# Patient Record
Sex: Male | Born: 1955
Health system: Southern US, Community
[De-identification: ages and names within clinical notes are randomized; demographics above are authoritative.]

## PROBLEM LIST (undated history)

## (undated) DIAGNOSIS — E785 Hyperlipidemia, unspecified: Secondary | ICD-10-CM

## (undated) DIAGNOSIS — M1990 Unspecified osteoarthritis, unspecified site: Secondary | ICD-10-CM

## (undated) DIAGNOSIS — K219 Gastro-esophageal reflux disease without esophagitis: Secondary | ICD-10-CM

## (undated) DIAGNOSIS — E119 Type 2 diabetes mellitus without complications: Secondary | ICD-10-CM

## (undated) DIAGNOSIS — M109 Gout, unspecified: Secondary | ICD-10-CM

## (undated) HISTORY — DX: Type 2 diabetes mellitus without complications: E11.9

## (undated) HISTORY — DX: Hyperlipidemia, unspecified: E78.5

## (undated) HISTORY — DX: Gout, unspecified: M10.9

## (undated) HISTORY — DX: Gastro-esophageal reflux disease without esophagitis: K21.9

## (undated) HISTORY — DX: Unspecified osteoarthritis, unspecified site: M19.90

---

## 2011-01-20 ENCOUNTER — Emergency Department (HOSPITAL_COMMUNITY)
Admission: EM | Admit: 2011-01-20 | Discharge: 2011-01-20 | Disposition: A | Discharge status: Home / Self Care | Payer: 59 | Attending: Emergency Medicine | Admitting: Emergency Medicine

## 2011-01-20 DIAGNOSIS — R197 Diarrhea, unspecified: Secondary | ICD-10-CM | POA: Insufficient documentation

## 2011-01-20 DIAGNOSIS — R1033 Periumbilical pain: Secondary | ICD-10-CM | POA: Insufficient documentation

## 2011-01-20 LAB — COMPREHENSIVE METABOLIC PANEL
Albumin: 4 g/dL (ref 3.5–5.2)
BUN: 22 mg/dL (ref 6–23)
Creatinine, Ser: 1.18 mg/dL (ref 0.50–1.35)
Total Bilirubin: 0.5 mg/dL (ref 0.3–1.2)
Total Protein: 8 g/dL (ref 6.0–8.3)

## 2011-01-20 LAB — URINALYSIS, ROUTINE W REFLEX MICROSCOPIC
Ketones, ur: NEGATIVE mg/dL
Leukocytes, UA: NEGATIVE
Nitrite: NEGATIVE
Specific Gravity, Urine: 1.022 (ref 1.005–1.030)
pH: 5 (ref 5.0–8.0)

## 2011-01-20 LAB — DIFFERENTIAL
Eosinophils Relative: 0 % (ref 0–5)
Lymphocytes Relative: 11 % — ABNORMAL LOW (ref 12–46)
Lymphs Abs: 1.3 10*3/uL (ref 0.7–4.0)
Monocytes Absolute: 0.6 10*3/uL (ref 0.1–1.0)

## 2011-01-20 LAB — CBC
HCT: 49.9 % (ref 39.0–52.0)
MCH: 32.3 pg (ref 26.0–34.0)
MCHC: 35.9 g/dL (ref 30.0–36.0)
MCV: 89.9 fL (ref 78.0–100.0)
RDW: 13.3 % (ref 11.5–15.5)

## 2011-01-20 LAB — LIPASE, BLOOD: Lipase: 32 U/L (ref 11–59)

## 2014-05-14 ENCOUNTER — Ambulatory Visit: Payer: Self-pay

## 2014-05-14 ENCOUNTER — Other Ambulatory Visit: Payer: Self-pay | Admitting: Occupational Medicine

## 2014-05-14 DIAGNOSIS — M25561 Pain in right knee: Secondary | ICD-10-CM

## 2017-04-30 DIAGNOSIS — J019 Acute sinusitis, unspecified: Secondary | ICD-10-CM | POA: Diagnosis not present

## 2017-07-30 DIAGNOSIS — R972 Elevated prostate specific antigen [PSA]: Secondary | ICD-10-CM | POA: Diagnosis not present

## 2017-08-07 DIAGNOSIS — R972 Elevated prostate specific antigen [PSA]: Secondary | ICD-10-CM | POA: Diagnosis not present

## 2017-09-23 ENCOUNTER — Ambulatory Visit (HOSPITAL_COMMUNITY)
Admission: EM | Admit: 2017-09-23 | Discharge: 2017-09-23 | Disposition: A | Discharge status: Home / Self Care | Payer: Worker's Compensation | Attending: Family Medicine | Admitting: Family Medicine

## 2017-09-23 ENCOUNTER — Ambulatory Visit (INDEPENDENT_AMBULATORY_CARE_PROVIDER_SITE_OTHER): Payer: Worker's Compensation

## 2017-09-23 ENCOUNTER — Encounter (HOSPITAL_COMMUNITY): Payer: Self-pay

## 2017-09-23 DIAGNOSIS — S9031XA Contusion of right foot, initial encounter: Secondary | ICD-10-CM

## 2017-09-23 MED ORDER — IBUPROFEN 800 MG PO TABS: 800.0000 mg | ORAL_TABLET | Freq: Three times a day (TID) | ORAL | 0 refills | Status: DC

## 2017-09-23 NOTE — ED Provider Notes (Signed)
[facility]    CSN: [id number] Arrival date & time: 09/23/17  1345     History   Chief Complaint Chief Complaint  Patient presents with  . Foot Pain    HPI Matthew Bolton is a 62 y.o. male.   Matthew Bolton presents with complaints of right foot pain after an approximately 40lb steel plate fell and landed on the top of his foot today at 1130. Was wearing a steel toe boot but struck just above the steel toe area. Pain and tingling immediately following. No longer with any pain. No previous injury. No numbness or tingling. Coworker brought him in and made him sit in wheelchair, patient states he feels he could ambulate without difficulty. Without contributing medical history.     ROS per HPI.      History reviewed. No pertinent past medical history.  There are no active problems to display for this patient.   History reviewed. No pertinent surgical history.     Home Medications    Prior to Admission medications   Medication Sig Start Date End Date Taking? Authorizing Provider  ibuprofen (ADVIL,MOTRIN) 800 MG tablet Take 1 tablet (800 mg total) by mouth 3 (three) times daily. 09/23/17   Georgetta HaberBurky, Natalie B, NP    Family History No family history on file.  Social History Social History   Tobacco Use  . Smoking status: Not on file  . Smokeless tobacco: Never Used  Substance Use Topics  . Alcohol use: Never    Frequency: Never  . Drug use: Never     Allergies   Patient has no known allergies.   Review of Systems Review of Systems   Physical Exam Triage Vital Signs ED Triage Vitals [09/23/17 1427]  Enc Vitals Group     BP 129/80     Pulse Rate 75     Resp 18     Temp 97.9 F (36.6 C)     Temp src      SpO2 (!) 10 %     Weight      Height      Head Circumference      Peak Flow      Pain Score 0     Pain Loc      Pain Edu?      Excl. in GC?    No data found.  Updated Vital Signs BP 129/80   Pulse 75   Temp 97.9 F (36.6 C)   Resp  18   SpO2 (!) 10%   Visual Acuity Right Eye Distance:   Left Eye Distance:   Bilateral Distance:    Right Eye Near:   Left Eye Near:    Bilateral Near:     Physical Exam  Constitutional: He is oriented to person, place, and time. He appears well-developed and well-nourished.  Cardiovascular: Normal rate and regular rhythm.  Pulmonary/Chest: Effort normal and breath sounds normal.  Musculoskeletal:       Right ankle: Normal.       Right foot: There is tenderness, bony tenderness and swelling. There is normal range of motion, normal capillary refill, no crepitus, no deformity and no laceration.       Feet:  Bruising, swelling and tenderness to 2-4th metatarsals; cap refill<2 seconds; full toe and ankle ROm; strong pedal pulse   Neurological: He is alert and oriented to person, place, and time.  Skin: Skin is warm and dry.     UC Treatments / Results  Labs (all labs ordered  are listed, but only abnormal results are displayed) Labs Reviewed - No data to display  EKG None  Radiology Dg Foot Complete Right  Result Date: 09/23/2017 CLINICAL DATA:  Steel plate fell on the foot. EXAM: RIGHT FOOT COMPLETE - 3+ VIEW COMPARISON:  None. FINDINGS: There is no evidence of fracture or dislocation. There is moderate osteoarthritis of the first MTP joint. There is a small plantar calcaneal spur. Soft tissues are unremarkable. IMPRESSION: No acute osseous injury of the right foot. Electronically Signed   By: Elige Ko   On: 09/23/2017 14:54    Procedures Procedures (including critical care time)  Medications Ordered in UC Medications - No data to display  Initial Impression / Assessment and Plan / UC Course  I have reviewed the triage vital signs and the nursing notes.  Pertinent labs & imaging results that were available during my care of the patient were reviewed by me and considered in my medical decision making (see chart for details).     Bruising visible. Xray without acute  findings of fracture. Consistent with contusion. Ice, elevation, ibuprofen for pain control. Return precautions provided. Patient verbalized understanding and agreeable to plan.   Final Clinical Impressions(s) / UC Diagnoses   Final diagnoses:  Contusion of right foot, initial encounter     Discharge Instructions     Ice, elevation, ibuprofen for pain control. May take 1-2 weeks for bruising to completely resolve. If persistent symptoms or worsening may return or follow with PCP and/or occupational health office.     ED Prescriptions    Medication Sig Dispense Auth. Provider   ibuprofen (ADVIL,MOTRIN) 800 MG tablet Take 1 tablet (800 mg total) by mouth 3 (three) times daily. 21 tablet Georgetta Haber, NP     Controlled Substance Prescriptions [place] Controlled Substance Registry consulted? Not Applicable   Georgetta Haber, NP 09/23/17 1505

## 2017-09-23 NOTE — ED Triage Notes (Signed)
Pt presents with complaints of hurting right foot at work today. Denies any pain.

## 2017-09-23 NOTE — Discharge Instructions (Signed)
Ice, elevation, ibuprofen for pain control. May take 1-2 weeks for bruising to completely resolve. If persistent symptoms or worsening may return or follow with PCP and/or occupational health office.

## 2017-10-10 DIAGNOSIS — R12 Heartburn: Secondary | ICD-10-CM | POA: Diagnosis not present

## 2017-10-10 DIAGNOSIS — R05 Cough: Secondary | ICD-10-CM | POA: Diagnosis not present

## 2018-07-31 ENCOUNTER — Encounter (HOSPITAL_COMMUNITY): Payer: Self-pay | Admitting: Emergency Medicine

## 2018-07-31 ENCOUNTER — Other Ambulatory Visit: Payer: Self-pay

## 2018-07-31 ENCOUNTER — Ambulatory Visit (HOSPITAL_COMMUNITY)
Admission: EM | Admit: 2018-07-31 | Discharge: 2018-07-31 | Disposition: A | Discharge status: Home / Self Care | Payer: 59 | Attending: Family Medicine | Admitting: Family Medicine

## 2018-07-31 ENCOUNTER — Ambulatory Visit (INDEPENDENT_AMBULATORY_CARE_PROVIDER_SITE_OTHER): Payer: 59

## 2018-07-31 DIAGNOSIS — M545 Low back pain, unspecified: Secondary | ICD-10-CM

## 2018-07-31 DIAGNOSIS — M47817 Spondylosis without myelopathy or radiculopathy, lumbosacral region: Secondary | ICD-10-CM | POA: Diagnosis not present

## 2018-07-31 DIAGNOSIS — L409 Psoriasis, unspecified: Secondary | ICD-10-CM

## 2018-07-31 DIAGNOSIS — M47814 Spondylosis without myelopathy or radiculopathy, thoracic region: Secondary | ICD-10-CM

## 2018-07-31 MED ORDER — METHYLPREDNISOLONE 4 MG PO TBPK: ORAL_TABLET | ORAL | 0 refills | Status: DC

## 2018-07-31 MED ORDER — IBUPROFEN 800 MG PO TABS: 800.0000 mg | ORAL_TABLET | Freq: Three times a day (TID) | ORAL | 0 refills | Status: DC

## 2018-07-31 MED ORDER — TIZANIDINE HCL 4 MG PO TABS: 4.0000 mg | ORAL_TABLET | Freq: Four times a day (QID) | ORAL | 0 refills | Status: DC | PRN

## 2018-07-31 NOTE — ED Provider Notes (Addendum)
[facility]    CSN: [id number] Arrival date & time: 07/31/18  3524     History   Chief Complaint Chief Complaint  Patient presents with   Back Pain    HPI Matthew Bolton is a 63 y.o. male.   HPI  Pleasant 63 year old gentleman who is here for low back pain.  He states he is never needed treatment for low back pain previously.  He is only had very minor soreness in the past.  He states that he feels like he is healthy in general, is on no prescription medication.  He has psoriasis that is had for many years.  No known back condition.  He states that he was shoveling sand over the weekend and a couple days later developed some soreness in his back.  It hurts in the morning when he wakes up.  He feels "bent over".  It takes a while to straighten up.  Is in the central low back.  No radiation.  No numbness.  No weakness. Patient has no bowel or bladder complaint He has a history of prostate enlargement but no prostate cancer.  He has had prostate biopsies He states he does have a family history of back problems, father with spinal stenosis  History reviewed. No pertinent past medical history.  There are no active problems to display for this patient.   History reviewed. No pertinent surgical history.     Home Medications    Prior to Admission medications   Medication Sig Start Date End Date Taking? Authorizing Provider  Aspirin-Acetaminophen-Caffeine (EXCEDRIN EXTRA STRENGTH PO) Take by mouth.   Yes [provider]  ibuprofen (ADVIL) 800 MG tablet Take 1 tablet (800 mg total) by mouth 3 (three) times daily. 07/31/18   Eustace Moore, MD  methylPREDNISolone (MEDROL DOSEPAK) 4 MG TBPK tablet Tad 07/31/18   Eustace Moore, MD  tiZANidine (ZANAFLEX) 4 MG tablet Take 1-2 tablets (4-8 mg total) by mouth every 6 (six) hours as needed for muscle spasms. 07/31/18   Eustace Moore, MD    Family History Family History  Problem Relation Age of Onset    Hypertension Mother    Cancer Mother    Diabetes Father     Social History Social History   Tobacco Use   Smoking status: Never Smoker   Smokeless tobacco: Never Used  Substance Use Topics   Alcohol use: Never    Frequency: Never   Drug use: Never     Allergies   Patient has no known allergies.   Review of Systems Review of Systems  Constitutional: Negative for chills and fever.  HENT: Negative for ear pain and sore throat.   Eyes: Negative for pain and visual disturbance.  Respiratory: Negative for cough and shortness of breath.   Cardiovascular: Negative for chest pain and palpitations.  Gastrointestinal: Negative for abdominal pain and vomiting.  Genitourinary: Negative for dysuria and hematuria.  Musculoskeletal: Positive for back pain. Negative for arthralgias.  Skin: Positive for rash. Negative for color change.  Neurological: Negative for seizures and syncope.  All other systems reviewed and are negative.    Physical Exam Triage Vital Signs ED Triage Vitals  Enc Vitals Group     BP 07/31/18 0830 (!) 142/98     Pulse Rate 07/31/18 0830 85     Resp 07/31/18 0830 18     Temp 07/31/18 0830 98.3 F (36.8 C)     Temp Source 07/31/18 0830 Oral     SpO2  07/31/18 0830 96 %     Weight --      Height --      Head Circumference --      Peak Flow --      Pain Score 07/31/18 0827 8     Pain Loc --      Pain Edu? --      Excl. in GC? --    No data found.  Updated Vital Signs BP (!) 142/98 (BP Location: Right Arm) Comment: large cuff   Pulse 85    Temp 98.3 F (36.8 C) (Oral)    Resp 18    SpO2 96%       Physical Exam Constitutional:      General: He is not in acute distress.    Appearance: He is well-developed. He is obese.     Comments: He appears uncomfortable.  Slow guarded movements.  HENT:     Head: Normocephalic and atraumatic.  Eyes:     Conjunctiva/sclera: Conjunctivae normal.     Pupils: Pupils are equal, round, and reactive to light.    Neck:     Musculoskeletal: Normal range of motion and neck supple.  Cardiovascular:     Rate and Rhythm: Normal rate and regular rhythm.     Heart sounds: Normal heart sounds.  Pulmonary:     Effort: Pulmonary effort is normal. No respiratory distress.     Breath sounds: Normal breath sounds.  Abdominal:     General: There is no distension.     Palpations: Abdomen is soft.     Comments: Protuberant  Musculoskeletal: Normal range of motion.     Comments: Lumbar spine is straight and symmetric.  Limited range of motion especially to flexion and extension. No tenderness or muscle spasm. Strength, sensation, range of motion, and reflexes are normal in both lower extremities. Straight leg raise is negative bilateral.   Skin:    General: Skin is warm and dry.  Neurological:     Mental Status: He is alert.      UC Treatments / Results  Labs (all labs ordered are listed, but only abnormal results are displayed) Labs Reviewed - No data to display  EKG None  Radiology Dg Lumbar Spine Complete  Result Date: 07/31/2018 CLINICAL DATA:  Lower back pain for 2 days EXAM: LUMBAR SPINE - COMPLETE 4+ VIEW COMPARISON:  None. FINDINGS: No evidence of acute fracture, endplate erosion, or bone lesion. Generalized spondylosis with L4-5 and L5-S1 moderate disc narrowing. There is also lower lumbar facet spurring. IMPRESSION: 1. No acute finding. 2. Spondylosis with lower lumbar disc and facet degeneration. Electronically Signed   By: Marnee SpringJonathon  Watts M.D.   On: 07/31/2018 09:37    Procedures Procedures (including critical care time)  Medications Ordered in UC Medications - No data to display  Initial Impression / Assessment and Plan / UC Course  I have reviewed the triage vital signs and the nursing notes.  Pertinent labs & imaging results that were available during my care of the patient were reviewed by me and considered in my medical decision making (see chart for details).     he has  multilevel spondylosis with fusion of the lower thoracic spines.  With the diffuse spondylitic changes I am worried that he has a psoriatic arthritis condition or possibly ankylosing spondylitis.  I recommended acute treatment for his pain, but he must follow-up with his PCP to get a referral to rheumatology.  He is advised that he may need blood  work, additional imaging. Greater than 50% of this visit was spent in counseling and coordinating care.  Total face to face time:   45 min discussing spondylosis, etiology, prognosis, Tx Final Clinical Impressions(s) / UC Diagnoses   Final diagnoses:  Acute midline low back pain without sciatica  Lumbosacral spondylosis without myelopathy  Thoracic spondylosis without myelopathy  Psoriasis     Discharge Instructions     Activity as tolerated.  Avoid bending and lifting activities Take the prednisone pack as directed.  Take all of day 1 today.  This is a strong anti-inflammatory. Take the muscle relaxer as needed.  This is useful at bedtime When you are done with the prednisone pack start ibuprofen 800 mg 3 times a day with food If you need extra pain control while on prednisone, take your extra strength or arthritis strength Tylenol You need PCP follow-up.  I have referred you to a PCP referral service    ED Prescriptions    Medication Sig Dispense Auth. Provider   ibuprofen (ADVIL) 800 MG tablet Take 1 tablet (800 mg total) by mouth 3 (three) times daily. 21 tablet Eustace Moore, MD   methylPREDNISolone (MEDROL DOSEPAK) 4 MG TBPK tablet Tad 21 tablet Eustace Moore, MD   tiZANidine (ZANAFLEX) 4 MG tablet Take 1-2 tablets (4-8 mg total) by mouth every 6 (six) hours as needed for muscle spasms. 21 tablet Eustace Moore, MD     Controlled Substance Prescriptions [place] Controlled Substance Registry consulted? Not Applicable   Eustace Moore, MD 07/31/18 1012    Eustace Moore, MD 07/31/18 1021

## 2018-07-31 NOTE — ED Triage Notes (Signed)
Lower back pain for a week.  Pain has gradually worsened since onset.  No leg pain or tingling.  Reports "uncomfortable" sensation in lower abdomen.    Patient reports doing yard work the weekend prior to this pain starting

## 2018-07-31 NOTE — Discharge Instructions (Signed)
Activity as tolerated.  Avoid bending and lifting activities Take the prednisone pack as directed.  Take all of day 1 today.  This is a strong anti-inflammatory. Take the muscle relaxer as needed.  This is useful at bedtime When you are done with the prednisone pack start ibuprofen 800 mg 3 times a day with food If you need extra pain control while on prednisone, take your extra strength or arthritis strength Tylenol You need PCP follow-up.  I have referred you to a PCP referral service

## 2018-08-21 ENCOUNTER — Other Ambulatory Visit: Payer: Self-pay

## 2018-08-21 ENCOUNTER — Telehealth: Payer: Self-pay | Admitting: Family Medicine

## 2018-08-22 ENCOUNTER — Encounter: Payer: Self-pay | Admitting: Family Medicine

## 2018-08-22 ENCOUNTER — Ambulatory Visit (INDEPENDENT_AMBULATORY_CARE_PROVIDER_SITE_OTHER): Payer: 59 | Admitting: Family Medicine

## 2018-08-22 VITALS — BP 120/83 | HR 77 | Temp 97.0°F | Ht 71.0 in | Wt 232.0 lb

## 2018-08-22 DIAGNOSIS — L409 Psoriasis, unspecified: Secondary | ICD-10-CM | POA: Diagnosis not present

## 2018-08-22 DIAGNOSIS — Z0000 Encounter for general adult medical examination without abnormal findings: Secondary | ICD-10-CM

## 2018-08-22 DIAGNOSIS — Z1159 Encounter for screening for other viral diseases: Secondary | ICD-10-CM | POA: Diagnosis not present

## 2018-08-22 DIAGNOSIS — Z0001 Encounter for general adult medical examination with abnormal findings: Secondary | ICD-10-CM

## 2018-08-22 DIAGNOSIS — Z1211 Encounter for screening for malignant neoplasm of colon: Secondary | ICD-10-CM | POA: Diagnosis not present

## 2018-08-22 NOTE — Progress Notes (Signed)
BP 120/83   Pulse 77   Temp (!) 97 F (36.1 C) (Oral)   Ht '5\' 11"'  (1.803 m)   Wt 232 lb (105.2 kg)   BMI 32.36 kg/m    Subjective:    Patient ID: Matthew Bolton, male    DOB: 03/06/1956, 63 y.o.   MRN: [id number]  HPI: Matthew Bolton is a 63 y.o. male presenting on 08/22/2018 for New Patient (Initial Visit) (Dr. Scotty Court); Establish Care; and Annual Exam   HPI Well adult exam and physical Patient is coming in for new patient exam and physical today.  He denies any major health issues except for psoriasis and he is wondering if he can get a refill on a cream that he has had this worked for his psoriasis that contains salicylic acid.  Other than this he denies any major health issues or concerns.  Patient is coming from Dr. Scotty Court because Dr. Scotty Court retired. Patient denies any chest pain, shortness of breath, headaches or vision issues, abdominal complaints, diarrhea, nausea, vomiting, or joint issues.   Relevant past medical, surgical, family and social history reviewed and updated as indicated. Interim medical history since our last visit reviewed. Allergies and medications reviewed and updated.  Review of Systems  Constitutional: Negative for chills and fever.  HENT: Negative for ear pain and tinnitus.   Eyes: Negative for pain.  Respiratory: Negative for cough, shortness of breath and wheezing.   Cardiovascular: Negative for chest pain, palpitations and leg swelling.  Gastrointestinal: Negative for abdominal pain, blood in stool, constipation and diarrhea.  Genitourinary: Negative for dysuria and hematuria.  Musculoskeletal: Negative for back pain and myalgias.  Skin: Positive for rash (Psoriasis on elbows and a small amount on knees).  Neurological: Negative for dizziness, weakness and headaches.  Psychiatric/Behavioral: Negative for suicidal ideas.    Per HPI unless specifically indicated above  Social History   Socioeconomic History  . Marital status: Married   Spouse name: Not on file  . Number of children: 2  . Years of education: Not on file  . Highest education level: Not on file  Occupational History  . Not on file  Social Needs  . Financial resource strain: Not on file  . Food insecurity:    Worry: Not on file    Inability: Not on file  . Transportation needs:    Medical: Not on file    Non-medical: Not on file  Tobacco Use  . Smoking status: Former Smoker    Packs/day: 0.25    Years: 15.00    Pack years: 3.75    Last attempt to quit: 1980    Years since quitting: 40.4  . Smokeless tobacco: Never Used  Substance and Sexual Activity  . Alcohol use: Yes    Frequency: Never    Comment: occasionally  . Drug use: Never  . Sexual activity: Yes    Birth control/protection: Post-menopausal  Lifestyle  . Physical activity:    Days per week: Not on file    Minutes per session: Not on file  . Stress: Not on file  Relationships  . Social connections:    Talks on phone: Not on file    Gets together: Not on file    Attends religious service: Not on file    Active member of club or organization: Not on file    Attends meetings of clubs or organizations: Not on file    Relationship status: Not on file  . Intimate partner violence:    Fear  of current or ex partner: Not on file    Emotionally abused: Not on file    Physically abused: Not on file    Forced sexual activity: Not on file  Other Topics Concern  . Not on file  Social History Narrative  . Not on file    History reviewed. No pertinent surgical history.  Family History  Problem Relation Age of Onset  . Hypertension Mother   . Cancer Mother   . Diabetes Father     Allergies as of 08/22/2018   No Known Allergies     Medication List       Accurate as of Aug 22, 2018 10:23 AM. If you have any questions, ask your nurse or doctor.        STOP taking these medications   methylPREDNISolone 4 MG Tbpk tablet Commonly known as:  MEDROL DOSEPAK Stopped by:  Fransisca Kaufmann Miley Blanchett, MD     TAKE these medications   EXCEDRIN EXTRA STRENGTH PO Take by mouth.   ibuprofen 800 MG tablet Commonly known as:  ADVIL Take 1 tablet (800 mg total) by mouth 3 (three) times daily.   tiZANidine 4 MG tablet Commonly known as:  Zanaflex Take 1-2 tablets (4-8 mg total) by mouth every 6 (six) hours as needed for muscle spasms.          Objective:    BP 120/83   Pulse 77   Temp (!) 97 F (36.1 C) (Oral)   Ht '5\' 11"'  (1.803 m)   Wt 232 lb (105.2 kg)   BMI 32.36 kg/m   Wt Readings from Last 3 Encounters:  08/22/18 232 lb (105.2 kg)    Physical Exam Vitals signs and nursing note reviewed.  Constitutional:      General: He is not in acute distress.    Appearance: He is well-developed. He is not diaphoretic.  HENT:     Right Ear: External ear normal.     Left Ear: External ear normal.     Nose: Nose normal.     Mouth/Throat:     Pharynx: No oropharyngeal exudate.  Eyes:     General: No scleral icterus.       Right eye: No discharge.     Conjunctiva/sclera: Conjunctivae normal.     Pupils: Pupils are equal, round, and reactive to light.  Neck:     Musculoskeletal: Neck supple.     Thyroid: No thyromegaly.  Cardiovascular:     Rate and Rhythm: Normal rate and regular rhythm.     Heart sounds: Normal heart sounds. No murmur.  Pulmonary:     Effort: Pulmonary effort is normal. No respiratory distress.     Breath sounds: Normal breath sounds. No wheezing.  Abdominal:     General: Bowel sounds are normal. There is no distension.     Palpations: Abdomen is soft.     Tenderness: There is no abdominal tenderness. There is no guarding or rebound.  Musculoskeletal: Normal range of motion.  Lymphadenopathy:     Cervical: No cervical adenopathy.  Skin:    General: Skin is warm and dry.     Findings: Rash (Psoriatic plaque on both elbow extensor surfaces, a very small plaque on left knee) present.  Neurological:     Mental Status: He is alert and oriented  to person, place, and time.     Coordination: Coordination normal.  Psychiatric:        Behavior: Behavior normal.  Assessment & Plan:   Problem List Items Addressed This Visit      Musculoskeletal and Integument   Psoriasis    Other Visit Diagnoses    Well adult exam    -  Primary   Relevant Orders   Hepatitis C antibody   CBC with Differential/Platelet   CMP14+EGFR   Lipid panel   Colon cancer screening       Relevant Orders   Cologuard   Need for hepatitis C screening test       Relevant Orders   Hepatitis C antibody      Refill of medication for psoriasis which was salicylic acid 38% in petrolatum that mixed at [facility] drug, did a handwritten prescription for this  Gave Cologuard and will check blood work Follow up plan: Return in about 1 year (around 08/22/2019), or if symptoms worsen or fail to improve, for Physical.  Caryl Pina, MD [place] 08/22/2018, 10:23 AM

## 2018-08-23 LAB — CMP14+EGFR
ALT: 23 IU/L (ref 0–44)
AST: 17 IU/L (ref 0–40)
Albumin/Globulin Ratio: 1.5 (ref 1.2–2.2)
Albumin: 4.1 g/dL (ref 3.8–4.8)
Alkaline Phosphatase: 89 IU/L (ref 39–117)
BUN/Creatinine Ratio: 15 (ref 10–24)
BUN: 19 mg/dL (ref 8–27)
Bilirubin Total: 0.7 mg/dL (ref 0.0–1.2)
CO2: 21 mmol/L (ref 20–29)
Calcium: 9.3 mg/dL (ref 8.6–10.2)
Chloride: 101 mmol/L (ref 96–106)
Creatinine, Ser: 1.29 mg/dL — ABNORMAL HIGH (ref 0.76–1.27)
GFR calc Af Amer: 69 mL/min/{1.73_m2} (ref >59)
GFR calc non Af Amer: 59 mL/min/{1.73_m2} — ABNORMAL LOW (ref >59)
Globulin, Total: 2.7 g/dL (ref 1.5–4.5)
Glucose: 164 mg/dL — ABNORMAL HIGH (ref 65–99)
Potassium: 4.4 mmol/L (ref 3.5–5.2)
Sodium: 138 mmol/L (ref 134–144)
Total Protein: 6.8 g/dL (ref 6.0–8.5)

## 2018-08-23 LAB — CBC WITH DIFFERENTIAL/PLATELET
Basophils Absolute: 0.1 10*3/uL (ref 0.0–0.2)
Basos: 1 %
EOS (ABSOLUTE): 0.3 10*3/uL (ref 0.0–0.4)
Eos: 4 %
Hematocrit: 50.8 % (ref 37.5–51.0)
Hemoglobin: 16.6 g/dL (ref 13.0–17.7)
Immature Grans (Abs): 0 10*3/uL (ref 0.0–0.1)
Immature Granulocytes: 1 %
Lymphocytes Absolute: 1.6 10*3/uL (ref 0.7–3.1)
Lymphs: 23 %
MCH: 29.3 pg (ref 26.6–33.0)
MCHC: 32.7 g/dL (ref 31.5–35.7)
MCV: 90 fL (ref 79–97)
Monocytes Absolute: 0.5 10*3/uL (ref 0.1–0.9)
Monocytes: 8 %
Neutrophils Absolute: 4.4 10*3/uL (ref 1.4–7.0)
Neutrophils: 63 %
Platelets: 152 10*3/uL (ref 150–450)
RBC: 5.66 x10E6/uL (ref 4.14–5.80)
RDW: 13.1 % (ref 11.6–15.4)
WBC: 7 10*3/uL (ref 3.4–10.8)

## 2018-08-23 LAB — HEPATITIS C ANTIBODY: Hep C Virus Ab: 0.2 s/co ratio (ref 0.0–0.9)

## 2018-08-23 LAB — LIPID PANEL
Chol/HDL Ratio: 5.7 ratio — ABNORMAL HIGH (ref 0.0–5.0)
Cholesterol, Total: 228 mg/dL — ABNORMAL HIGH (ref 100–199)
HDL: 40 mg/dL (ref >39)
LDL Calculated: 157 mg/dL — ABNORMAL HIGH (ref 0–99)
Triglycerides: 153 mg/dL — ABNORMAL HIGH (ref 0–149)
VLDL Cholesterol Cal: 31 mg/dL (ref 5–40)

## 2018-09-11 ENCOUNTER — Encounter: Payer: Self-pay | Admitting: *Deleted

## 2018-09-23 ENCOUNTER — Telehealth: Payer: Self-pay | Admitting: Family Medicine

## 2018-09-23 MED ORDER — ATORVASTATIN CALCIUM 20 MG PO TABS: 20.0000 mg | ORAL_TABLET | Freq: Every day | ORAL | 0 refills | Status: DC

## 2018-09-23 NOTE — Telephone Encounter (Signed)
Left message.  Medication sent to pharmacy.  Will need to come back in for A1C

## 2018-10-13 ENCOUNTER — Other Ambulatory Visit: Payer: Self-pay

## 2018-10-13 ENCOUNTER — Other Ambulatory Visit: Payer: 59

## 2018-10-13 DIAGNOSIS — R7309 Other abnormal glucose: Secondary | ICD-10-CM

## 2018-10-13 LAB — BAYER DCA HB A1C WAIVED: HB A1C (BAYER DCA - WAIVED): 7.5 % — ABNORMAL HIGH (ref <7.0)

## 2018-10-16 LAB — COLOGUARD: Cologuard: NEGATIVE

## 2018-10-17 ENCOUNTER — Other Ambulatory Visit: Payer: Self-pay | Admitting: *Deleted

## 2018-10-17 DIAGNOSIS — E119 Type 2 diabetes mellitus without complications: Secondary | ICD-10-CM

## 2018-10-17 MED ORDER — METFORMIN HCL 500 MG PO TABS: 500.0000 mg | ORAL_TABLET | Freq: Two times a day (BID) | ORAL | 0 refills | Status: DC

## 2018-11-10 ENCOUNTER — Ambulatory Visit (INDEPENDENT_AMBULATORY_CARE_PROVIDER_SITE_OTHER): Payer: 59 | Admitting: Family Medicine

## 2018-11-10 ENCOUNTER — Encounter: Payer: Self-pay | Admitting: Family Medicine

## 2018-11-10 DIAGNOSIS — J0100 Acute maxillary sinusitis, unspecified: Secondary | ICD-10-CM | POA: Diagnosis not present

## 2018-11-10 MED ORDER — FLUTICASONE PROPIONATE 50 MCG/ACT NA SUSP: 2.0000 | Freq: Every day | NASAL | 6 refills | Status: DC

## 2018-11-10 MED ORDER — AMOXICILLIN-POT CLAVULANATE 875-125 MG PO TABS: 1.0000 | ORAL_TABLET | Freq: Two times a day (BID) | ORAL | 0 refills | Status: AC

## 2018-11-10 MED ORDER — PREDNISONE 20 MG PO TABS: ORAL_TABLET | ORAL | 0 refills | Status: DC

## 2018-11-10 NOTE — Progress Notes (Addendum)
Virtual Visit via telephone Note Due to COVID-19 pandemic this visit was conducted virtually. This visit type was conducted due to national recommendations for restrictions regarding the COVID-19 Pandemic (e.g. social distancing, sheltering in place) in an effort to limit this patient's exposure and mitigate transmission in our community. All issues noted in this document were discussed and addressed.  A physical exam was not performed with this format.   I connected with Matthew Bolton on 11/10/18 at 1415 by telephone and verified that I am speaking with the correct person using two identifiers. Matthew Bolton is currently located at home and family is currently with them during visit. The provider, Kari BaarsMichelle Marlane Hirschmann, FNP is located in their office at time of visit.  I discussed the limitations, risks, security and privacy concerns of performing an evaluation and management service by telephone and the availability of in person appointments. I also discussed with the patient that there may be a patient responsible charge related to this service. The patient expressed understanding and agreed to proceed.  Subjective:  Patient ID: Matthew Bolton, male    DOB: 02/14/1956, 63 y.o.   MRN: [id number]  Chief Complaint:  Sinus Problem   HPI: Matthew Bolton is a 63 y.o. male presenting on 11/10/2018 for Sinus Problem   Pt reports several days of maxillary sinus pressure, pressure behind his eyes, postnasal drainage, rhinorrhea, and cough. Pt states he has nasal congestion and puffiness under his eyes. He states he has been treating symptomatically without resolution of symptoms. No chest pain, shortness of breath, wheezing, or fatigue. Does have chills, has not checked temperature.     Relevant past medical, surgical, family, and social history reviewed and updated as indicated.  Allergies and medications reviewed and updated.   History reviewed. No pertinent past medical history.   History reviewed. No pertinent surgical history.  Social History   Socioeconomic History  . Marital status: Married    Spouse name: Not on file  . Number of children: 2  . Years of education: Not on file  . Highest education level: Not on file  Occupational History  . Not on file  Social Needs  . Financial resource strain: Not on file  . Food insecurity    Worry: Not on file    Inability: Not on file  . Transportation needs    Medical: Not on file    Non-medical: Not on file  Tobacco Use  . Smoking status: Former Smoker    Packs/day: 0.25    Years: 15.00    Pack years: 3.75    Quit date: 1980    Years since quitting: 40.6  . Smokeless tobacco: Never Used  Substance and Sexual Activity  . Alcohol use: Yes    Frequency: Never    Comment: occasionally  . Drug use: Never  . Sexual activity: Yes    Birth control/protection: Post-menopausal  Lifestyle  . Physical activity    Days per week: Not on file    Minutes per session: Not on file  . Stress: Not on file  Relationships  . Social Musicianconnections    Talks on phone: Not on file    Gets together: Not on file    Attends religious service: Not on file    Active member of club or organization: Not on file    Attends meetings of clubs or organizations: Not on file    Relationship status: Not on file  . Intimate partner violence    Fear of current or ex  partner: Not on file    Emotionally abused: Not on file    Physically abused: Not on file    Forced sexual activity: Not on file  Other Topics Concern  . Not on file  Social History Narrative  . Not on file    Outpatient Encounter Medications as of 11/10/2018  Medication Sig  . amoxicillin-clavulanate (AUGMENTIN) 875-125 MG tablet Take 1 tablet by mouth 2 (two) times daily for 10 days.  . Aspirin-Acetaminophen-Caffeine (EXCEDRIN EXTRA STRENGTH PO) Take by mouth.  Marland Kitchen. atorvastatin (LIPITOR) 20 MG tablet Take 1 tablet (20 mg total) by mouth daily.  . fluticasone (FLONASE)  50 MCG/ACT nasal spray Place 2 sprays into both nostrils daily.  Marland Kitchen. ibuprofen (ADVIL) 800 MG tablet Take 1 tablet (800 mg total) by mouth 3 (three) times daily.  . metFORMIN (GLUCOPHAGE) 500 MG tablet Take 1 tablet (500 mg total) by mouth 2 (two) times daily with a meal.  . predniSONE (DELTASONE) 20 MG tablet 2 po at sametime daily for 5 days  . tiZANidine (ZANAFLEX) 4 MG tablet Take 1-2 tablets (4-8 mg total) by mouth every 6 (six) hours as needed for muscle spasms.   No facility-administered encounter medications on file as of 11/10/2018.     No Known Allergies  Review of Systems  Constitutional: Positive for chills. Negative for activity change, appetite change, diaphoresis, fatigue, fever and unexpected weight change.  HENT: Positive for congestion, facial swelling, postnasal drip, rhinorrhea, sinus pressure, sinus pain and sore throat. Negative for hearing loss, mouth sores, nosebleeds, sneezing, tinnitus, trouble swallowing and voice change.   Eyes: Negative.  Negative for photophobia and visual disturbance.  Respiratory: Positive for cough. Negative for chest tightness, shortness of breath and wheezing.   Cardiovascular: Negative for chest pain, palpitations and leg swelling.  Gastrointestinal: Negative for abdominal pain, blood in stool, constipation, diarrhea, nausea and vomiting.  Endocrine: Negative.   Genitourinary: Negative for decreased urine volume, difficulty urinating, dysuria, frequency and urgency.  Musculoskeletal: Negative for arthralgias and myalgias.  Skin: Negative.   Allergic/Immunologic: Negative.   Neurological: Positive for headaches. Negative for dizziness, weakness and light-headedness.  Hematological: Negative.   Psychiatric/Behavioral: Negative for confusion, hallucinations, sleep disturbance and suicidal ideas.  All other systems reviewed and are negative.        Observations/Objective: No vital signs or physical exam, this was a telephone or virtual  health encounter.  Pt alert and oriented, answers all questions appropriately, and able to speak in full sentences.    Assessment and Plan: Matthew Bolton was seen today for sinus problem.  Diagnoses and all orders for this visit:  Acute non-recurrent maxillary sinusitis Ongoing maxillary sinus pressure and congestion that has not resolved with symptomatic care. Due to ongoing and worsening symptoms, will initiate Augmentin. Continue symptomatic care. Increase water intake. Use frequent saline nasal sprays. Medications as prescribed. Report any new or worsening symptoms. Follow up as needed.  -     amoxicillin-clavulanate (AUGMENTIN) 875-125 MG tablet; Take 1 tablet by mouth 2 (two) times daily for 10 days. -     fluticasone (FLONASE) 50 MCG/ACT nasal spray; Place 2 sprays into both nostrils daily. -     predniSONE (DELTASONE) 20 MG tablet; 2 po at sametime daily for 5 days     Follow Up Instructions: Return if symptoms worsen or fail to improve.    I discussed the assessment and treatment plan with the patient. The patient was provided an opportunity to ask questions and all were answered. The patient  agreed with the plan and demonstrated an understanding of the instructions.   The patient was advised to call back or seek an in-person evaluation if the symptoms worsen or if the condition fails to improve as anticipated.  The above assessment and management plan was discussed with the patient. The patient verbalized understanding of and has agreed to the management plan. Patient is aware to call the clinic if symptoms persist or worsen. Patient is aware when to return to the clinic for a follow-up visit. Patient educated on when it is appropriate to go to the emergency department.    I provided 15 minutes of non-face-to-face time during this encounter. The call started at 1415. The call ended at 1430. The other time was used for coordination of care.    Monia Pouch, FNP-C [facility] [address] [place], [place] [address] [phone] 11/10/18

## 2018-11-27 ENCOUNTER — Ambulatory Visit: Payer: 59 | Admitting: Family Medicine

## 2018-12-10 ENCOUNTER — Telehealth: Payer: Self-pay | Admitting: Nutrition

## 2018-12-10 NOTE — Telephone Encounter (Signed)
vm left to let him know we can do mychart viist or face to face office visit since I'll be back in office.Asked him to return call to let me know.

## 2018-12-16 ENCOUNTER — Encounter: Payer: 59 | Attending: Family Medicine | Admitting: Nutrition

## 2019-01-05 ENCOUNTER — Ambulatory Visit: Payer: 59 | Admitting: Nutrition

## 2019-02-09 ENCOUNTER — Telehealth: Payer: Self-pay | Admitting: Family Medicine

## 2019-02-10 ENCOUNTER — Ambulatory Visit: Payer: 59 | Admitting: Family Medicine

## 2019-02-10 ENCOUNTER — Other Ambulatory Visit: Payer: Self-pay

## 2019-02-10 ENCOUNTER — Encounter: Payer: Self-pay | Admitting: Family Medicine

## 2019-02-10 VITALS — BP 124/88 | HR 104 | Temp 99.1°F | Ht 71.0 in | Wt 233.0 lb

## 2019-02-10 DIAGNOSIS — M10372 Gout due to renal impairment, left ankle and foot: Secondary | ICD-10-CM | POA: Diagnosis not present

## 2019-02-10 MED ORDER — COLCHICINE 0.6 MG PO TABS: ORAL_TABLET | ORAL | 1 refills | Status: AC

## 2019-02-10 MED ORDER — METHYLPREDNISOLONE ACETATE 40 MG/ML IJ SUSP
40.0000 mg | Freq: Once | INTRAMUSCULAR | Status: AC
  Administered 2019-02-10: 40 mg via INTRAMUSCULAR

## 2019-02-10 NOTE — Progress Notes (Signed)
Subjective: CC: Gout PCP: Dettinger, Elige Radon, MD VPX:TGGYIRS Dains is a 63 y.o. male presenting to clinic today for:  1.  Gout Patient reports onset of left great toe swelling and pain on Saturday.  Pain and swelling got quite a bit worse.  He has been keeping the foot elevated but not really taking any medication for this issue.  He previously was prescribed colchicine by his previous PCP.  He does not have any more of these pills.  Denies any recent change in diet including consumption of red meats or beer.  ROS: Per HPI  No Known Allergies No past medical history on file.  Current Outpatient Medications:  .  Aspirin-Acetaminophen-Caffeine (EXCEDRIN EXTRA STRENGTH PO), Take by mouth., Disp: , Rfl:  .  atorvastatin (LIPITOR) 20 MG tablet, Take 1 tablet (20 mg total) by mouth daily., Disp: 90 tablet, Rfl: 0 .  ibuprofen (ADVIL) 800 MG tablet, Take 1 tablet (800 mg total) by mouth 3 (three) times daily., Disp: 21 tablet, Rfl: 0 .  metFORMIN (GLUCOPHAGE) 500 MG tablet, Take 1 tablet (500 mg total) by mouth 2 (two) times daily with a meal., Disp: 180 tablet, Rfl: 0 .  colchicine 0.6 MG tablet, Day 1: Take 1.2 mg at the first sign of flare, followed in 1 hour with a single dose of 0.6 mg. Then 1 tablet twice daily until gout resolved., Disp: 60 tablet, Rfl: 1  Current Facility-Administered Medications:  .  methylPREDNISolone acetate (DEPO-MEDROL) injection 40 mg, 40 mg, Intramuscular, Once, Raliegh Ip, DO Social History   Socioeconomic History  . Marital status: Married    Spouse name: Not on file  . Number of children: 2  . Years of education: Not on file  . Highest education level: Not on file  Occupational History  . Not on file  Social Needs  . Financial resource strain: Not on file  . Food insecurity    Worry: Not on file    Inability: Not on file  . Transportation needs    Medical: Not on file    Non-medical: Not on file  Tobacco Use  . Smoking status:  Former Smoker    Packs/day: 0.25    Years: 15.00    Pack years: 3.75    Quit date: 1980    Years since quitting: 40.9  . Smokeless tobacco: Never Used  Substance and Sexual Activity  . Alcohol use: Yes    Frequency: Never    Comment: occasionally  . Drug use: Never  . Sexual activity: Yes    Birth control/protection: Post-menopausal  Lifestyle  . Physical activity    Days per week: Not on file    Minutes per session: Not on file  . Stress: Not on file  Relationships  . Social Musician on phone: Not on file    Gets together: Not on file    Attends religious service: Not on file    Active member of club or organization: Not on file    Attends meetings of clubs or organizations: Not on file    Relationship status: Not on file  . Intimate partner violence    Fear of current or ex partner: Not on file    Emotionally abused: Not on file    Physically abused: Not on file    Forced sexual activity: Not on file  Other Topics Concern  . Not on file  Social History Narrative  . Not on file   Family History  Problem Relation Age of Onset  . Hypertension Mother   . Cancer Mother   . Diabetes Father     Objective: Office vital signs reviewed. BP 124/88   Pulse (!) 104   Temp 99.1 F (37.3 C) (Temporal)   Ht 5\' 11"  (1.803 m)   Wt 233 lb (105.7 kg)   BMI 32.50 kg/m   Physical Examination:  General: Awake, alert, obese, No acute distress MSK: antalgic gait and station.  Left great toe: Erythema, warmth and mild soft tissue swelling noted around the first MCP.  He has mild tenderness to palpation.  Range of motion is limited secondary to pain and swelling. Extremities: Warm, well-perfused, no edema Neuro: Light touch sensation grossly intact  Assessment/ Plan: 63 y.o. male   1. Acute gout due to renal impairment involving toe of left foot Clinically consistent with gout flare.  I suspect secondary to renal impairment based on May labs.  He appears to have a  CKD 3.  I have dosed colchicine for him and we discussed the directions verbally.  I have also given him a dose of Depo-Medrol given pain.  We will obtain uric acid level, BMP and CBC.  Red flags discussed.  He will follow-up as needed - colchicine 0.6 MG tablet; Day 1: Take 1.2 mg at the first sign of flare, followed in 1 hour with a single dose of 0.6 mg. Then 1 tablet twice daily until gout resolved.  Dispense: 60 tablet; Refill: 1 - methylPREDNISolone acetate (DEPO-MEDROL) injection 40 mg - Uric Acid - CBC - Basic Metabolic Panel   Orders Placed This Encounter  Procedures  . Uric Acid  . CBC  . Basic Metabolic Panel   Meds ordered this encounter  Medications  . colchicine 0.6 MG tablet    Sig: Day 1: Take 1.2 mg at the first sign of flare, followed in 1 hour with a single dose of 0.6 mg. Then 1 tablet twice daily until gout resolved.    Dispense:  60 tablet    Refill:  1  . methylPREDNISolone acetate (DEPO-MEDROL) injection 40 mg     Janora Norlander, DO [facility] [phone]

## 2019-02-10 NOTE — Patient Instructions (Addendum)
Hold your cholesterol medication for the next few days while on the Colchicine.  You had labs performed today.  You will be contacted with the results of the labs once they are available, usually in the next 3 business days for routine lab work.  If you have an active my chart account, they will be released to your MyChart.  If you prefer to have these labs released to you via telephone, please let us know.  If you had a pap smear or biopsy performed, expect to be contacted in about 7-10 days.   Gout  Gout is painful swelling of your joints. Gout is a type of arthritis. It is caused by having too much uric acid in your body. Uric acid is a chemical that is made when your body breaks down substances called purines. If your body has too much uric acid, sharp crystals can form and build up in your joints. This causes pain and swelling. Gout attacks can happen quickly and be very painful (acute gout). Over time, the attacks can affect more joints and happen more often (chronic gout). What are the causes?  Too much uric acid in your blood. This can happen because: ? Your kidneys do not remove enough uric acid from your blood. ? Your body makes too much uric acid. ? You eat too many foods that are high in purines. These foods include organ meats, some seafood, and beer.  Trauma or stress. What increases the risk?  Having a family history of gout.  Being male and middle-aged.  Being male and having gone through menopause.  Being very overweight (obese).  Drinking alcohol, especially beer.  Not having enough water in the body (being dehydrated).  Losing weight too quickly.  Having an organ transplant.  Having lead poisoning.  Taking certain medicines.  Having kidney disease.  Having a skin condition called psoriasis. What are the signs or symptoms? An attack of acute gout usually happens in just one joint. The most common place is the big toe. Attacks often start at night.  Other joints that may be affected include joints of the feet, ankle, knee, fingers, wrist, or elbow. Symptoms of an attack may include:  Very bad pain.  Warmth.  Swelling.  Stiffness.  Shiny, red, or purple skin.  Tenderness. The affected joint may be very painful to touch.  Chills and fever. Chronic gout may cause symptoms more often. More joints may be involved. You may also have white or yellow lumps (tophi) on your hands or feet or in other areas near your joints. How is this treated?  Treatment for this condition has two phases: treating an acute attack and preventing future attacks.  Acute gout treatment may include: ? NSAIDs. ? Steroids. These are taken by mouth or injected into a joint. ? Colchicine. This medicine relieves pain and swelling. It can be given by mouth or through an IV tube.  Preventive treatment may include: ? Taking small doses of NSAIDs or colchicine daily. ? Using a medicine that reduces uric acid levels in your blood. ? Making changes to your diet. You may need to see a food expert (dietitian) about what to eat and drink to prevent gout. Follow these instructions at home: During a gout attack   If told, put ice on the painful area: ? Put ice in a plastic bag. ? Place a towel between your skin and the bag. ? Leave the ice on for 20 minutes, 2-3 times a day.  Raise (elevate)  the painful joint above the level of your heart as often as you can.  Rest the joint as much as possible. If the joint is in your leg, you may be given crutches.  Follow instructions from your doctor about what you cannot eat or drink. Avoiding future gout attacks  Eat a low-purine diet. Avoid foods and drinks such as: ? Liver. ? Kidney. ? Anchovies. ? Asparagus. ? Herring. ? Mushrooms. ? Mussels. ? Beer.  Stay at a healthy weight. If you want to lose weight, talk with your doctor. Do not lose weight too fast.  Start or continue an exercise plan as told by your  doctor. Eating and drinking  Drink enough fluids to keep your pee (urine) pale yellow.  If you drink alcohol: ? Limit how much you use to:  0-1 drink a day for women.  0-2 drinks a day for men. ? Be aware of how much alcohol is in your drink. In the [place] one drink equals one 12 oz bottle of beer (355 mL), one 5 oz glass of wine (148 mL), or one 1 oz glass of hard liquor (44 mL). General instructions  Take over-the-counter and prescription medicines only as told by your doctor.  Do not drive or use heavy machinery while taking prescription pain medicine.  Return to your normal activities as told by your doctor. Ask your doctor what activities are safe for you.  Keep all follow-up visits as told by your doctor. This is important. Contact a doctor if:  You have another gout attack.  You still have symptoms of a gout attack after 10 days of treatment.  You have problems (side effects) because of your medicines.  You have chills or a fever.  You have burning pain when you pee (urinate).  You have pain in your lower back or belly. Get help right away if:  You have very bad pain.  Your pain cannot be controlled.  You cannot pee. Summary  Gout is painful swelling of the joints.  The most common site of pain is the big toe, but it can affect other joints.  Medicines and avoiding some foods can help to prevent and treat gout attacks. This information is not intended to replace advice given to you by your health care provider. Make sure you discuss any questions you have with your health care provider. Document Released: 12/20/2007 Document Revised: 10/02/2017 Document Reviewed: 10/02/2017 Elsevier Patient Education  2020 [employer].

## 2019-02-10 NOTE — Telephone Encounter (Signed)
Appointment 02/10/2019 at 2:15 pm with Dr. Lajuana Ripple

## 2019-02-11 LAB — BASIC METABOLIC PANEL
BUN/Creatinine Ratio: 14 (ref 10–24)
BUN: 17 mg/dL (ref 8–27)
CO2: 23 mmol/L (ref 20–29)
Calcium: 9.1 mg/dL (ref 8.6–10.2)
Chloride: 99 mmol/L (ref 96–106)
Creatinine, Ser: 1.25 mg/dL (ref 0.76–1.27)
GFR calc Af Amer: 71 mL/min/{1.73_m2} (ref >59)
GFR calc non Af Amer: 61 mL/min/{1.73_m2} (ref >59)
Glucose: 268 mg/dL — ABNORMAL HIGH (ref 65–99)
Potassium: 4.4 mmol/L (ref 3.5–5.2)
Sodium: 137 mmol/L (ref 134–144)

## 2019-02-11 LAB — URIC ACID: Uric Acid: 5.9 mg/dL (ref 3.7–8.6)

## 2019-02-11 LAB — CBC
Hematocrit: 48.4 % (ref 37.5–51.0)
Hemoglobin: 16.3 g/dL (ref 13.0–17.7)
MCH: 29.9 pg (ref 26.6–33.0)
MCHC: 33.7 g/dL (ref 31.5–35.7)
MCV: 89 fL (ref 79–97)
Platelets: 150 10*3/uL (ref 150–450)
RBC: 5.46 x10E6/uL (ref 4.14–5.80)
RDW: 13.4 % (ref 11.6–15.4)
WBC: 7.2 10*3/uL (ref 3.4–10.8)

## 2019-06-12 ENCOUNTER — Ambulatory Visit: Payer: Self-pay | Attending: Internal Medicine

## 2019-06-12 DIAGNOSIS — Z23 Encounter for immunization: Secondary | ICD-10-CM

## 2019-06-12 NOTE — Progress Notes (Signed)
   Covid-19 Vaccination Clinic  Name:  Matthew Bolton    MRN: [id number] DOB: 1956-01-12  06/12/2019  [name] was observed post Covid-19 immunization for 15 minutes without incident. He was provided with Vaccine Information Sheet and instruction to access the V-Safe system.   [name] was instructed to call 911 with any severe reactions post vaccine: Marland Kitchen Difficulty breathing  . Swelling of face and throat  . A fast heartbeat  . A bad rash all over body  . Dizziness and weakness   Immunizations Administered    Name Date Dose VIS Date Route   Moderna COVID-19 Vaccine 06/12/2019  9:46 AM 0.5 mL 02/24/2019 Intramuscular   Manufacturer: Moderna   Lot: [id number]   NDC: 21624-469-50

## 2019-06-17 ENCOUNTER — Other Ambulatory Visit: Payer: Self-pay | Admitting: Family Medicine

## 2019-06-17 ENCOUNTER — Other Ambulatory Visit: Payer: Self-pay

## 2019-06-17 ENCOUNTER — Ambulatory Visit: Payer: 59 | Admitting: Family Medicine

## 2019-06-17 ENCOUNTER — Encounter: Payer: Self-pay | Admitting: Family Medicine

## 2019-06-17 VITALS — BP 125/88 | HR 77 | Temp 98.4°F | Ht 71.0 in | Wt 252.0 lb

## 2019-06-17 DIAGNOSIS — E1121 Type 2 diabetes mellitus with diabetic nephropathy: Secondary | ICD-10-CM

## 2019-06-17 DIAGNOSIS — R350 Frequency of micturition: Secondary | ICD-10-CM | POA: Diagnosis not present

## 2019-06-17 DIAGNOSIS — E1122 Type 2 diabetes mellitus with diabetic chronic kidney disease: Secondary | ICD-10-CM

## 2019-06-17 DIAGNOSIS — N1831 Chronic kidney disease, stage 3a: Secondary | ICD-10-CM

## 2019-06-17 LAB — MICROSCOPIC EXAMINATION
Bacteria, UA: NONE SEEN
Epithelial Cells (non renal): NONE SEEN /hpf (ref 0–10)
RBC: NONE SEEN /hpf (ref 0–2)
Renal Epithel, UA: NONE SEEN /hpf
WBC, UA: NONE SEEN /hpf (ref 0–5)

## 2019-06-17 LAB — URINALYSIS, ROUTINE W REFLEX MICROSCOPIC
Bilirubin, UA: NEGATIVE
Ketones, UA: NEGATIVE
Leukocytes,UA: NEGATIVE
Nitrite, UA: NEGATIVE
Protein,UA: NEGATIVE
RBC, UA: NEGATIVE
Specific Gravity, UA: <1.005 — ABNORMAL LOW (ref 1.005–1.030)
Urobilinogen, Ur: 0.2 mg/dL (ref 0.2–1.0)
pH, UA: 5 (ref 5.0–7.5)

## 2019-06-17 LAB — BAYER DCA HB A1C WAIVED: HB A1C (BAYER DCA - WAIVED): >14 % — ABNORMAL HIGH (ref <7.0)

## 2019-06-17 MED ORDER — METFORMIN HCL 500 MG PO TABS: 500.0000 mg | ORAL_TABLET | Freq: Two times a day (BID) | ORAL | 3 refills | Status: DC

## 2019-06-17 MED ORDER — ATORVASTATIN CALCIUM 20 MG PO TABS: 20.0000 mg | ORAL_TABLET | Freq: Every day | ORAL | 3 refills | Status: DC

## 2019-06-17 MED ORDER — SITAGLIPTIN PHOSPHATE 100 MG PO TABS: 100.0000 mg | ORAL_TABLET | Freq: Every day | ORAL | 5 refills | Status: DC

## 2019-06-17 NOTE — Progress Notes (Signed)
BP 125/88   Pulse 77   Temp 98.4 F (36.9 C)   Ht _0  (1.803 m)   Wt 252 lb (114.3 kg)   SpO2 99%   BMI 35.15 kg/m    Subjective:   Patient ID: Matthew Bolton, male    DOB: 30-Jul-1955, 64 y.o.   MRN: [id number]  HPI: Matthew Bolton is a 64 y.o. male presenting on 06/17/2019 for Urinary Frequency (denies pain, bleeding or burning. Getting worse. C/O foul odor)   HPI Patient is coming in complaining of urinary frequency that is definitely worsened over the past couple weeks.  He says he has to urinate every 1-2 hours and it is a good amounts and is not having any stream issues.  He does admit that he is been out of his Metformin recently.  He has not had his A1c checked recently as well.  He denies any urinary retention but is just making a lot of urine very frequently all the time.  He denies any lightheadedness or dizziness or any other symptoms.  He denies any blood in his urine.  Relevant past medical, surgical, family and social history reviewed and updated as indicated. Interim medical history since our last visit reviewed. Allergies and medications reviewed and updated.  Review of Systems  Constitutional: Negative for chills and fever.  Respiratory: Negative for shortness of breath and wheezing.   Cardiovascular: Negative for chest pain and leg swelling.  Genitourinary: Positive for frequency. Negative for flank pain and hematuria.  Skin: Negative for rash.  All other systems reviewed and are negative.   Per HPI unless specifically indicated above   Allergies as of 06/17/2019   No Known Allergies     Medication List       Accurate as of June 17, 2019  2:39 PM. If you have any questions, ask your nurse or doctor.        atorvastatin 20 MG tablet Commonly known as: LIPITOR Take 1 tablet (20 mg total) by mouth daily.   colchicine 0.6 MG tablet Day 1: Take 1.2 mg at the first sign of flare, followed in 1 hour with a single dose of 0.6 mg. Then 1 tablet  twice daily until gout resolved.   EXCEDRIN EXTRA STRENGTH PO Take by mouth.   ibuprofen 800 MG tablet Commonly known as: ADVIL Take 1 tablet (800 mg total) by mouth 3 (three) times daily.   metFORMIN 500 MG tablet Commonly known as: GLUCOPHAGE Take 1 tablet (500 mg total) by mouth 2 (two) times daily with a meal.        Objective:   BP 125/88   Pulse 77   Temp 98.4 F (36.9 C)   Ht _1  (1.803 m)   Wt 252 lb (114.3 kg)   SpO2 99%   BMI 35.15 kg/m   Wt Readings from Last 3 Encounters:  06/17/19 252 lb (114.3 kg)  02/10/19 233 lb (105.7 kg)  08/22/18 232 lb (105.2 kg)    Physical Exam Vitals and nursing note reviewed.  Constitutional:      General: He is not in acute distress.    Appearance: He is well-developed. He is not diaphoretic.  Eyes:     General: No scleral icterus.    Conjunctiva/sclera: Conjunctivae normal.  Neck:     Thyroid: No thyromegaly.  Cardiovascular:     Rate and Rhythm: Normal rate and regular rhythm.     Heart sounds: Normal heart sounds. No murmur.  Pulmonary:  Effort: Pulmonary effort is normal. No respiratory distress.     Breath sounds: Normal breath sounds. No wheezing.  Skin:    General: Skin is warm and dry.     Findings: No rash.  Neurological:     Mental Status: He is alert and oriented to person, place, and time.     Coordination: Coordination normal.  Psychiatric:        Behavior: Behavior normal.    A1c greater than 14 Urinalysis: 3+ glucose, otherwise negative  Assessment & Plan:   Problem List Items Addressed This Visit    None    Visit Diagnoses    Type 2 diabetes mellitus with stage 3a chronic kidney disease, without long-term current use of insulin (HCC)    -  Primary   Relevant Medications   atorvastatin (LIPITOR) 20 MG tablet   metFORMIN (GLUCOPHAGE) 500 MG tablet   sitaGLIPtin (JANUVIA) 100 MG tablet   Other Relevant Orders   Bayer DCA Hb A1c Waived   Frequent urination       Relevant Orders    Urine Culture   Urinalysis, Routine w reflex microscopic   BMP8+EGFR   Bayer DCA Hb A1c Waived   PSA, total and free    Urine shows positive glucose, will check A1c, likely because he has uncontrolled diabetes, will restart metformin but if A1c is really high then we will have to restart other things as well.  A1c is greater than 14, will start on Januvia and restart on Metformin.  We will also set him up with diabetic education. Follow up plan: Return in about 3 months (around 09/17/2019), or if symptoms worsen or fail to improve, for Diabetes and hypertension cholesterol.  Counseling provided for all of the vaccine components Orders Placed This Encounter  Procedures  . Urine Culture  . Urinalysis, Routine w reflex microscopic    Caryl Pina, MD [facility] 06/17/2019, 2:39 PM

## 2019-06-18 LAB — BMP8+EGFR
BUN/Creatinine Ratio: 14 (ref 10–24)
BUN: 20 mg/dL (ref 8–27)
CO2: 22 mmol/L (ref 20–29)
Calcium: 9.5 mg/dL (ref 8.6–10.2)
Chloride: 93 mmol/L — ABNORMAL LOW (ref 96–106)
Creatinine, Ser: 1.4 mg/dL — ABNORMAL HIGH (ref 0.76–1.27)
GFR calc Af Amer: 62 mL/min/{1.73_m2} (ref >59)
GFR calc non Af Amer: 53 mL/min/{1.73_m2} — ABNORMAL LOW (ref >59)
Glucose: 597 mg/dL (ref 65–99)
Potassium: 4.7 mmol/L (ref 3.5–5.2)
Sodium: 130 mmol/L — ABNORMAL LOW (ref 134–144)

## 2019-06-18 LAB — PSA, TOTAL AND FREE
PSA, Free Pct: 32.1 %
PSA, Free: 1.25 ng/mL
Prostate Specific Ag, Serum: 3.9 ng/mL (ref 0.0–4.0)

## 2019-06-18 LAB — URINE CULTURE: Organism ID, Bacteria: NO GROWTH

## 2019-06-23 ENCOUNTER — Ambulatory Visit: Payer: 59 | Admitting: Family Medicine

## 2019-07-14 ENCOUNTER — Ambulatory Visit: Payer: Self-pay | Attending: Internal Medicine

## 2019-07-14 DIAGNOSIS — Z23 Encounter for immunization: Secondary | ICD-10-CM

## 2019-07-14 NOTE — Progress Notes (Signed)
   Covid-19 Vaccination Clinic  Name:  Matthew Bolton    MRN: [id number] DOB: 05/08/1955  07/14/2019  [name] was observed post Covid-19 immunization for 15 minutes without incident. He was provided with Vaccine Information Sheet and instruction to access the V-Safe system.   [name] was instructed to call 911 with any severe reactions post vaccine: Marland Kitchen Difficulty breathing  . Swelling of face and throat  . A fast heartbeat  . A bad rash all over body  . Dizziness and weakness   Immunizations Administered    Name Date Dose VIS Date Route   Moderna COVID-19 Vaccine 07/14/2019  8:53 AM 0.5 mL 02/2019 Intramuscular   Manufacturer: Moderna   Lot: [id number]   NDC: 25189-842-10

## 2019-07-21 ENCOUNTER — Telehealth: Payer: Self-pay | Admitting: *Deleted

## 2019-07-21 NOTE — Telephone Encounter (Signed)
Januvia is not covered. Pt must fail two.  Drug Name  PA Requirement Metformin 500 Mg Tab tier-5 NOT Required* Pioglitazone 30 Mg Tab tier-11 NOT Required* Glipizide 5 Mg Tab tier-12 NOT Required* Jardiance 25 Mg Tab tier-4, QL NOT Required* Farxiga 10 Mg Tab tier-7, QL NOT Required*

## 2019-07-22 MED ORDER — JARDIANCE 10 MG PO TABS
10.0000 mg | ORAL_TABLET | Freq: Every day | ORAL | 2 refills | Status: DC
Start: 2019-07-22 — End: 2019-11-19

## 2019-07-22 NOTE — Telephone Encounter (Signed)
lmtcb

## 2019-07-22 NOTE — Telephone Encounter (Signed)
Please him know that the Januvia appears to not be covered and they wanted him to try other agents first, I sent Jardiance 10 mg for him because of his renal function I do not want to go any higher.  He may need to go through prescription assistance to get coverage for Jardiance. Arville Care, MD [facility] 07/22/2019, 2:06 PM

## 2019-07-23 NOTE — Telephone Encounter (Signed)
Patient aware and verbalized understanding. °

## 2019-08-18 ENCOUNTER — Ambulatory Visit (INDEPENDENT_AMBULATORY_CARE_PROVIDER_SITE_OTHER): Payer: No Typology Code available for payment source | Admitting: Family Medicine

## 2019-08-18 ENCOUNTER — Other Ambulatory Visit: Payer: Self-pay

## 2019-08-18 ENCOUNTER — Encounter: Payer: Self-pay | Admitting: Family Medicine

## 2019-08-18 VITALS — BP 108/70 | HR 62 | Temp 97.7°F | Ht 71.0 in | Wt 223.1 lb

## 2019-08-18 DIAGNOSIS — E1169 Type 2 diabetes mellitus with other specified complication: Secondary | ICD-10-CM

## 2019-08-18 DIAGNOSIS — E1122 Type 2 diabetes mellitus with diabetic chronic kidney disease: Secondary | ICD-10-CM | POA: Insufficient documentation

## 2019-08-18 DIAGNOSIS — N1832 Chronic kidney disease, stage 3b: Secondary | ICD-10-CM | POA: Diagnosis not present

## 2019-08-18 DIAGNOSIS — E785 Hyperlipidemia, unspecified: Secondary | ICD-10-CM

## 2019-08-18 DIAGNOSIS — E1121 Type 2 diabetes mellitus with diabetic nephropathy: Secondary | ICD-10-CM | POA: Diagnosis not present

## 2019-08-18 DIAGNOSIS — N1831 Chronic kidney disease, stage 3a: Secondary | ICD-10-CM

## 2019-08-18 LAB — BAYER DCA HB A1C WAIVED: HB A1C (BAYER DCA - WAIVED): 9.2 % — ABNORMAL HIGH (ref <7.0)

## 2019-08-18 NOTE — Progress Notes (Signed)
BP 108/70   Pulse 62   Temp 97.7 F (36.5 C) (Temporal)   Ht '5\' 11"'  (1.803 m)   Wt 223 lb 2 oz (101.2 kg)   BMI 31.12 kg/m    Subjective:   Patient ID: Matthew Bolton, male    DOB: [birth year], 64 y.o.   MRN: [id number]  HPI: Matthew Bolton is a 64 y.o. male presenting on 08/18/2019 for Medical Management of Chronic Issues   HPI Type 2 diabetes mellitus Patient comes in today for recheck of his diabetes. Patient has been currently taking Metformin and Jardiance, he feels like his blood sugars are doing good, he is not checking it but he feels a lot better and he is lost some weight and is trying to change his diet. His A1c today is 9.3 which is only 2 months since her last one which was greater than 14 last time. I imagine that it is even better than that 9.3 reflects currently. Patient is not currently on an ACE inhibitor/ARB. Patient has not seen an ophthalmologist this year. Patient denies any issues with their feet. The symptom started onset as an adult hyperlipidemia and CKD ARE RELATED TO DM   Hyperlipidemia Patient is coming in for recheck of his hyperlipidemia. The patient is currently taking atorvastatin. They deny any issues with myalgias or history of liver damage from it. They deny any focal numbness or weakness or chest pain.   Relevant past medical, surgical, family and social history reviewed and updated as indicated. Interim medical history since our last visit reviewed. Allergies and medications reviewed and updated.  Review of Systems  Constitutional: Negative for chills and fever.  Respiratory: Negative for shortness of breath and wheezing.   Cardiovascular: Negative for chest pain and leg swelling.  Musculoskeletal: Negative for back pain and gait problem.  Skin: Negative for rash.  Neurological: Negative for dizziness, weakness and light-headedness.  All other systems reviewed and are negative.   Per HPI unless specifically indicated  above   Allergies as of 08/18/2019   No Known Allergies     Medication List       Accurate as of Aug 18, 2019  9:36 AM. If you have any questions, ask your nurse or doctor.        STOP taking these medications   EXCEDRIN EXTRA STRENGTH PO Stopped by: Fransisca Kaufmann Macaela Presas, MD   sitaGLIPtin 100 MG tablet Commonly known as: Januvia Stopped by: Worthy Rancher, MD     TAKE these medications   atorvastatin 20 MG tablet Commonly known as: LIPITOR Take 1 tablet (20 mg total) by mouth daily.   colchicine 0.6 MG tablet Day 1: Take 1.2 mg at the first sign of flare, followed in 1 hour with a single dose of 0.6 mg. Then 1 tablet twice daily until gout resolved.   ibuprofen 800 MG tablet Commonly known as: ADVIL Take 1 tablet (800 mg total) by mouth 3 (three) times daily.   Jardiance 10 MG Tabs tablet Generic drug: empagliflozin Take 10 mg by mouth daily before breakfast.   metFORMIN 500 MG tablet Commonly known as: GLUCOPHAGE Take 1 tablet (500 mg total) by mouth 2 (two) times daily with a meal.        Objective:   BP 108/70   Pulse 62   Temp 97.7 F (36.5 C) (Temporal)   Ht '5\' 11"'  (1.803 m)   Wt 223 lb 2 oz (101.2 kg)   BMI 31.12 kg/m   Wt Readings from  Last 3 Encounters:  08/18/19 223 lb 2 oz (101.2 kg)  06/17/19 252 lb (114.3 kg)  02/10/19 233 lb (105.7 kg)    Physical Exam Vitals and nursing note reviewed.  Constitutional:      General: He is not in acute distress.    Appearance: He is well-developed. He is not diaphoretic.  Eyes:     General: No scleral icterus.    Conjunctiva/sclera: Conjunctivae normal.  Neck:     Thyroid: No thyromegaly.  Cardiovascular:     Rate and Rhythm: Normal rate and regular rhythm.     Heart sounds: Normal heart sounds. No murmur.  Pulmonary:     Effort: Pulmonary effort is normal. No respiratory distress.     Breath sounds: Normal breath sounds. No wheezing.  Musculoskeletal:        General: Normal range of motion.      Cervical back: Neck supple.  Lymphadenopathy:     Cervical: No cervical adenopathy.  Skin:    General: Skin is warm and dry.     Findings: No rash.  Neurological:     Mental Status: He is alert and oriented to person, place, and time.     Coordination: Coordination normal.  Psychiatric:        Behavior: Behavior normal.       Assessment & Plan:   Problem List Items Addressed This Visit      Endocrine   Type 2 diabetes mellitus with stage 3a chronic kidney disease, without long-term current use of insulin ([place]) - Primary   Relevant Orders   Microalbumin / creatinine urine ratio   Bayer DCA Hb A1c Waived   CMP14+EGFR   Hyperlipidemia associated with type 2 diabetes mellitus ([place])   Relevant Orders   CMP14+EGFR     Genitourinary   Stage 3b chronic kidney disease   Relevant Orders   CMP14+EGFR      A1c is 9 after only 54-monthgap between being greater than 14, likely better than what it reflects, patient is losing weight and change in diet. No medication changes for today and will follow up in 3 months. Follow up plan: Return in about 3 months (around 11/18/2019), or if symptoms worsen or fail to improve, for Diabetes and hyperlipidemia and CKD.  Counseling provided for all of the vaccine components Orders Placed This Encounter  Procedures  . Microalbumin / creatinine urine ratio  . Bayer DCA Hb A1c Waived  . [place] MD [facility] 08/18/2019, 9:36 AM

## 2019-08-19 LAB — CMP14+EGFR
ALT: 19 IU/L (ref 0–44)
AST: 19 IU/L (ref 0–40)
Albumin/Globulin Ratio: 1.5 (ref 1.2–2.2)
Albumin: 4.1 g/dL (ref 3.8–4.8)
Alkaline Phosphatase: 80 IU/L (ref 48–121)
BUN/Creatinine Ratio: 11 (ref 10–24)
BUN: 17 mg/dL (ref 8–27)
Bilirubin Total: 0.5 mg/dL (ref 0.0–1.2)
CO2: 21 mmol/L (ref 20–29)
Calcium: 9.2 mg/dL (ref 8.6–10.2)
Chloride: 103 mmol/L (ref 96–106)
Creatinine, Ser: 1.48 mg/dL — ABNORMAL HIGH (ref 0.76–1.27)
GFR calc Af Amer: 58 mL/min/{1.73_m2} — ABNORMAL LOW (ref >59)
GFR calc non Af Amer: 50 mL/min/{1.73_m2} — ABNORMAL LOW (ref >59)
Globulin, Total: 2.8 g/dL (ref 1.5–4.5)
Glucose: 134 mg/dL — ABNORMAL HIGH (ref 65–99)
Potassium: 4.4 mmol/L (ref 3.5–5.2)
Sodium: 140 mmol/L (ref 134–144)
Total Protein: 6.9 g/dL (ref 6.0–8.5)

## 2019-08-19 LAB — MICROALBUMIN / CREATININE URINE RATIO
Creatinine, Urine: 72.2 mg/dL
Microalb/Creat Ratio: 9 mg/g creat (ref 0–29)
Microalbumin, Urine: 6.6 ug/mL

## 2019-08-25 MED FILL — METFORMIN HCL 500 MG TABS: 500 | 90 days supply | Qty: 180 | Fill #0

## 2019-08-25 MED FILL — JARDIANCE 10 MG TABLET: 10 | 30 days supply | Qty: 30 | Fill #0

## 2019-08-25 MED FILL — JANUVIA 100 MG TABLET: 100 | 30 days supply | Qty: 30 | Fill #0

## 2019-08-25 MED FILL — ATORVASTATIN 20 MG TABLET: 20 | 90 days supply | Qty: 90 | Fill #0

## 2019-10-19 MED FILL — JANUVIA 100 MG TABLET: 100 | 30 days supply | Qty: 30 | Fill #1

## 2019-11-19 ENCOUNTER — Other Ambulatory Visit: Payer: Self-pay

## 2019-11-19 ENCOUNTER — Ambulatory Visit (INDEPENDENT_AMBULATORY_CARE_PROVIDER_SITE_OTHER): Payer: No Typology Code available for payment source | Admitting: Family Medicine

## 2019-11-19 ENCOUNTER — Encounter: Payer: Self-pay | Admitting: Family Medicine

## 2019-11-19 VITALS — BP 109/76 | HR 73 | Temp 97.9°F | Ht 71.0 in | Wt 220.0 lb

## 2019-11-19 DIAGNOSIS — E1169 Type 2 diabetes mellitus with other specified complication: Secondary | ICD-10-CM | POA: Diagnosis not present

## 2019-11-19 DIAGNOSIS — Z23 Encounter for immunization: Secondary | ICD-10-CM

## 2019-11-19 DIAGNOSIS — E1121 Type 2 diabetes mellitus with diabetic nephropathy: Secondary | ICD-10-CM | POA: Diagnosis not present

## 2019-11-19 DIAGNOSIS — N1832 Chronic kidney disease, stage 3b: Secondary | ICD-10-CM

## 2019-11-19 DIAGNOSIS — N1831 Chronic kidney disease, stage 3a: Secondary | ICD-10-CM

## 2019-11-19 DIAGNOSIS — E785 Hyperlipidemia, unspecified: Secondary | ICD-10-CM

## 2019-11-19 DIAGNOSIS — E1122 Type 2 diabetes mellitus with diabetic chronic kidney disease: Secondary | ICD-10-CM

## 2019-11-19 LAB — BAYER DCA HB A1C WAIVED: HB A1C (BAYER DCA - WAIVED): 6.3 % (ref <7.0)

## 2019-11-19 NOTE — Progress Notes (Signed)
BP 109/76   Pulse 73   Temp 97.9 F (36.6 C)   Ht '5\' 11"'  (1.803 m)   Wt 220 lb (99.8 kg)   SpO2 96%   BMI 30.68 kg/m    Subjective:   Patient ID: Matthew Bolton, male    DOB: 1955-07-18, 64 y.o.   MRN: [id number]  HPI: Matthew Bolton is a 64 y.o. male presenting on 11/19/2019 for Medical Management of Chronic Issues and Diabetes   HPI Type 2 diabetes mellitus Patient comes in today for recheck of his diabetes. Patient has been currently taking Januvia and Metformin and A1c is 6.3. Patient is not currently on an ACE inhibitor/ARB because of hypotension. Patient has not seen an ophthalmologist this year. Patient denies any issues with their feet. The symptom started onset as an adult hyperlipidemia and CKD stage III ARE RELATED TO DM   Hyperlipidemia Patient is coming in for recheck of his hyperlipidemia. The patient is currently taking atorvastatin. They deny any issues with myalgias or history of liver damage from it. They deny any focal numbness or weakness or chest pain.   Relevant past medical, surgical, family and social history reviewed and updated as indicated. Interim medical history since our last visit reviewed. Allergies and medications reviewed and updated.  Review of Systems  Constitutional: Negative for chills and fever.  Eyes: Negative for visual disturbance.  Respiratory: Negative for shortness of breath and wheezing.   Cardiovascular: Negative for chest pain and leg swelling.  Musculoskeletal: Negative for back pain and gait problem.  Skin: Negative for rash.  Neurological: Negative for dizziness, weakness and light-headedness.  All other systems reviewed and are negative.   Per HPI unless specifically indicated above   Allergies as of 11/19/2019   No Known Allergies     Medication List       Accurate as of November 19, 2019 11:59 PM. If you have any questions, ask your nurse or doctor.        STOP taking these medications   Jardiance 10 MG  Tabs tablet Generic drug: empagliflozin Stopped by: Matthew Kaufmann Lolita Faulds, MD     TAKE these medications   atorvastatin 20 MG tablet Commonly known as: LIPITOR Take 1 tablet (20 mg total) by mouth daily.   colchicine 0.6 MG tablet Day 1: Take 1.2 mg at the first sign of flare, followed in 1 hour with a single dose of 0.6 mg. Then 1 tablet twice daily until gout resolved.   ibuprofen 800 MG tablet Commonly known as: ADVIL Take 1 tablet (800 mg total) by mouth 3 (three) times daily.   metFORMIN 500 MG tablet Commonly known as: GLUCOPHAGE Take 1 tablet (500 mg total) by mouth 2 (two) times daily with a meal.   sitaGLIPtin 100 MG tablet Commonly known as: JANUVIA Take 100 mg by mouth daily.        Objective:   BP 109/76   Pulse 73   Temp 97.9 F (36.6 C)   Ht '5\' 11"'  (1.803 m)   Wt 220 lb (99.8 kg)   SpO2 96%   BMI 30.68 kg/m   Wt Readings from Last 3 Encounters:  11/19/19 220 lb (99.8 kg)  08/18/19 223 lb 2 oz (101.2 kg)  06/17/19 252 lb (114.3 kg)    Physical Exam Vitals and nursing note reviewed.  Constitutional:      General: He is not in acute distress.    Appearance: He is well-developed. He is not diaphoretic.  Eyes:  General: No scleral icterus.    Conjunctiva/sclera: Conjunctivae normal.  Neck:     Thyroid: No thyromegaly.  Cardiovascular:     Rate and Rhythm: Normal rate and regular rhythm.     Heart sounds: Normal heart sounds. No murmur heard.   Pulmonary:     Effort: Pulmonary effort is normal. No respiratory distress.     Breath sounds: Normal breath sounds. No wheezing.  Musculoskeletal:        General: Normal range of motion.     Cervical back: Neck supple.  Lymphadenopathy:     Cervical: No cervical adenopathy.  Skin:    General: Skin is warm and dry.     Findings: No rash.  Neurological:     Mental Status: He is alert and oriented to person, place, and time.     Coordination: Coordination normal.  Psychiatric:        Behavior:  Behavior normal.       Assessment & Plan:   Problem List Items Addressed This Visit      Endocrine   Type 2 diabetes mellitus with stage 3a chronic kidney disease, without long-term current use of insulin ([place]) - Primary   Relevant Medications   sitaGLIPtin (JANUVIA) 100 MG tablet   Other Relevant Orders   Bayer DCA Hb A1c Waived (Completed)   CMP14+EGFR (Completed)   Hyperlipidemia associated with type 2 diabetes mellitus ([place])   Relevant Medications   sitaGLIPtin (JANUVIA) 100 MG tablet     Genitourinary   Stage 3b chronic kidney disease    Other Visit Diagnoses    Need for Tdap vaccination       Relevant Orders   Tdap vaccine greater than or equal to 7yo IM (Completed)      A1c looks good, continue current medication, no changes. Follow up plan: Return in about 3 months (around 02/19/2020), or if symptoms worsen or fail to improve, for diabetes and hld.  Counseling provided for all of the vaccine components Orders Placed This Encounter  Procedures  . Tdap vaccine greater than or equal to 7yo IM  . Bayer DCA Hb A1c Waived  . Pevely Jabri Blancett, MD [facility] 11/24/2019, 9:42 PM

## 2019-11-20 LAB — CMP14+EGFR
ALT: 18 IU/L (ref 0–44)
AST: 16 IU/L (ref 0–40)
Albumin/Globulin Ratio: 1.4 (ref 1.2–2.2)
Albumin: 3.9 g/dL (ref 3.8–4.8)
Alkaline Phosphatase: 88 IU/L (ref 48–121)
BUN/Creatinine Ratio: 16 (ref 10–24)
BUN: 22 mg/dL (ref 8–27)
Bilirubin Total: 0.3 mg/dL (ref 0.0–1.2)
CO2: 23 mmol/L (ref 20–29)
Calcium: 9 mg/dL (ref 8.6–10.2)
Chloride: 104 mmol/L (ref 96–106)
Creatinine, Ser: 1.39 mg/dL — ABNORMAL HIGH (ref 0.76–1.27)
GFR calc Af Amer: 62 mL/min/{1.73_m2} (ref >59)
GFR calc non Af Amer: 54 mL/min/{1.73_m2} — ABNORMAL LOW (ref >59)
Globulin, Total: 2.8 g/dL (ref 1.5–4.5)
Glucose: 94 mg/dL (ref 65–99)
Potassium: 4.5 mmol/L (ref 3.5–5.2)
Sodium: 140 mmol/L (ref 134–144)
Total Protein: 6.7 g/dL (ref 6.0–8.5)

## 2019-11-23 MED FILL — JANUVIA 100 MG TABLET: 100 | 30 days supply | Qty: 30 | Fill #2

## 2019-11-26 IMAGING — DX LUMBAR SPINE - COMPLETE 4+ VIEW
5 series · 5 of 5 positions shown · non-contrast
Comparison: None.

CLINICAL DATA: Lower back pain for 2 days

EXAM:
LUMBAR SPINE - COMPLETE 4+ VIEW

[l-spine obl (1 of 2)]
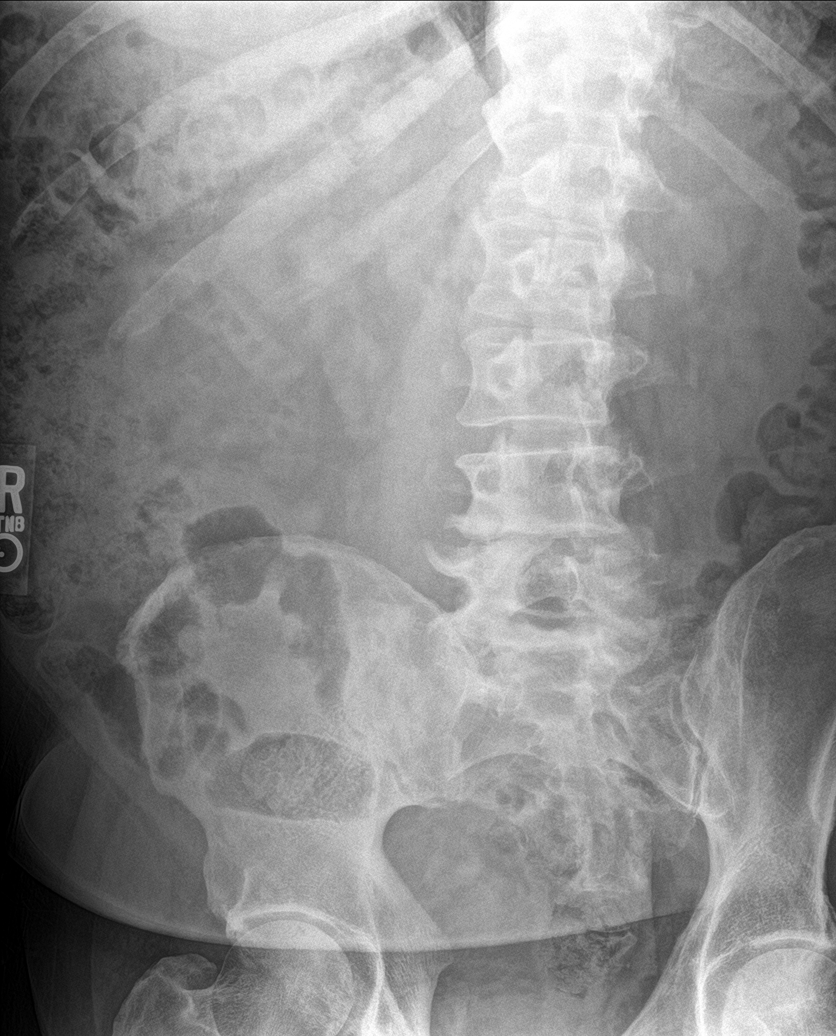

[l-spine obl (2 of 2)]
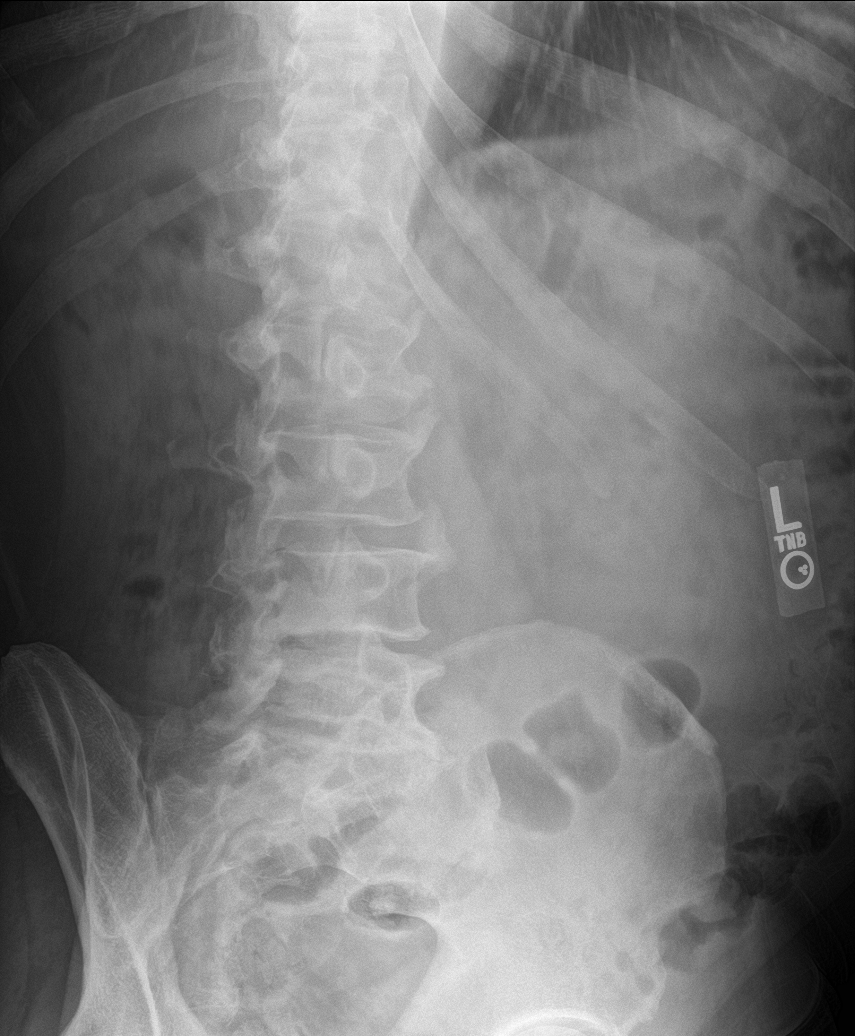

[l-spine lat]
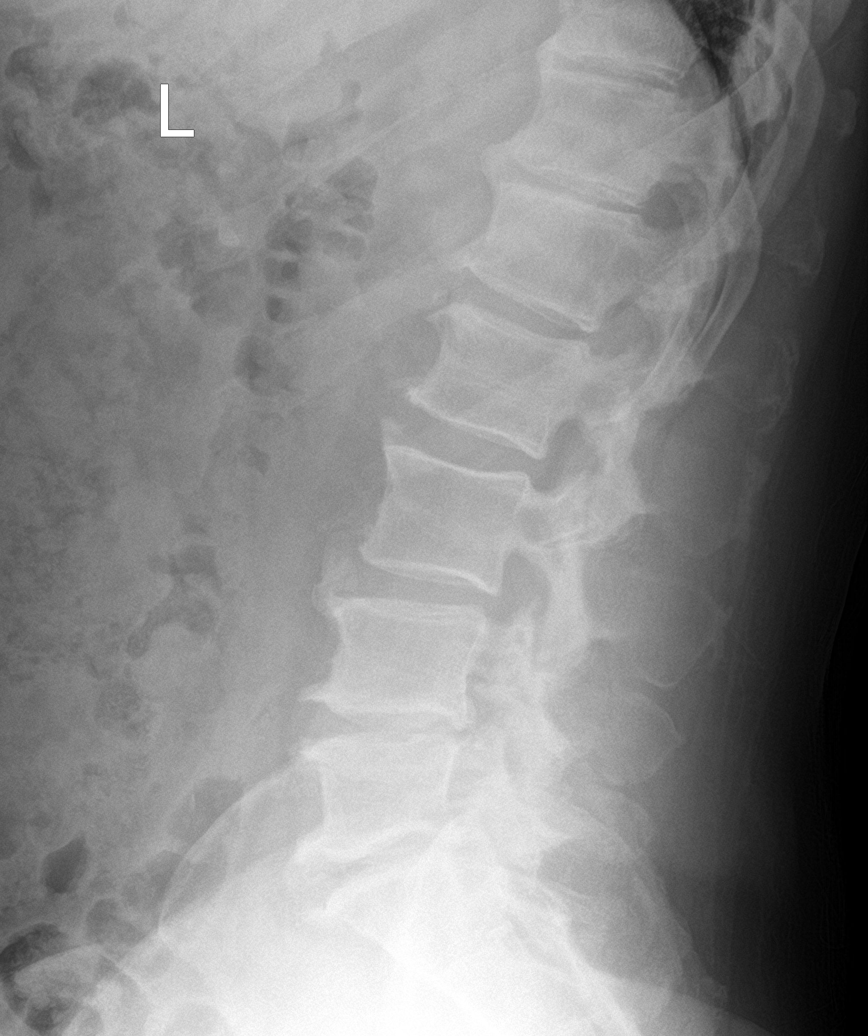

[l-spine spot]
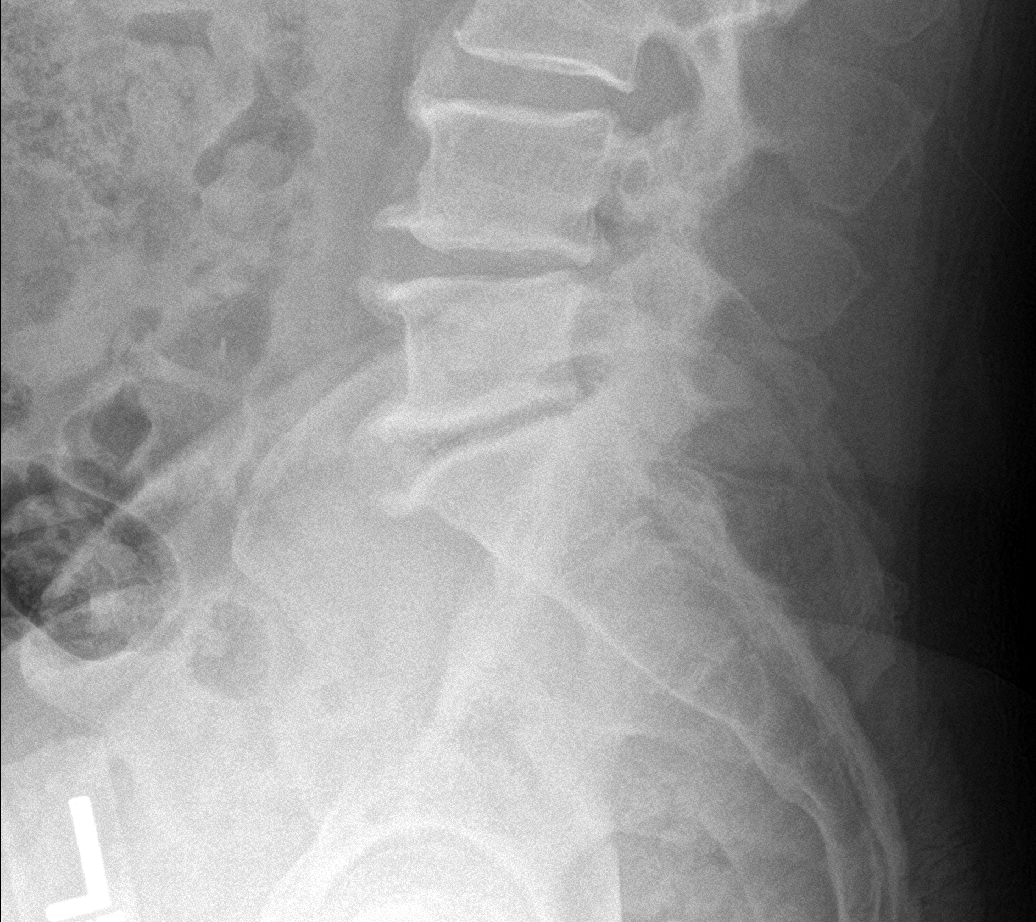

[l-spine ap]
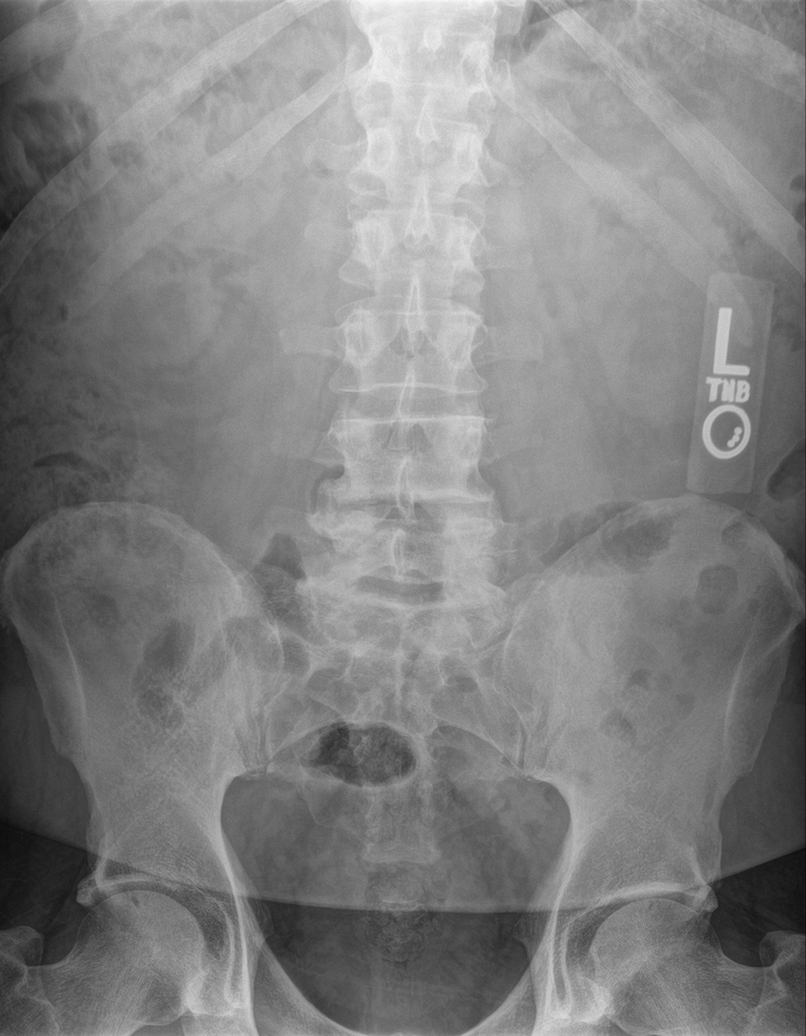

[5 of 5 positions shown; findings below may reference images not displayed]

FINDINGS: No evidence of acute fracture, endplate erosion, or bone lesion.
Generalized spondylosis with L4-5 and L5-S1 moderate disc narrowing.
There is also lower lumbar facet spurring.
IMPRESSION: 1. No acute finding.
2. Spondylosis with lower lumbar disc and facet degeneration.

## 2019-12-11 MED FILL — METFORMIN HCL 500 MG TABS: 500 | 90 days supply | Qty: 180 | Fill #1

## 2019-12-11 MED FILL — ATORVASTATIN CALCIUM 20 MG: 20 | 90 days supply | Qty: 90 | Fill #1

## 2019-12-25 MED FILL — JANUVIA 100 MG TABLET: 100 | 30 days supply | Qty: 30 | Fill #3

## 2020-01-08 ENCOUNTER — Telehealth: Payer: Self-pay

## 2020-01-15 ENCOUNTER — Ambulatory Visit: Payer: No Typology Code available for payment source

## 2020-01-26 MED FILL — JANUVIA 100 MG TABLET: 100 | 30 days supply | Qty: 30 | Fill #4

## 2020-02-22 ENCOUNTER — Ambulatory Visit (INDEPENDENT_AMBULATORY_CARE_PROVIDER_SITE_OTHER): Payer: No Typology Code available for payment source | Admitting: Family Medicine

## 2020-02-22 ENCOUNTER — Other Ambulatory Visit: Payer: Self-pay | Admitting: Family Medicine

## 2020-02-22 ENCOUNTER — Encounter: Payer: Self-pay | Admitting: Family Medicine

## 2020-02-22 ENCOUNTER — Other Ambulatory Visit: Payer: Self-pay

## 2020-02-22 VITALS — BP 123/81 | HR 74 | Temp 97.0°F | Ht 71.0 in | Wt 226.0 lb

## 2020-02-22 DIAGNOSIS — E785 Hyperlipidemia, unspecified: Secondary | ICD-10-CM

## 2020-02-22 DIAGNOSIS — N1832 Chronic kidney disease, stage 3b: Secondary | ICD-10-CM

## 2020-02-22 DIAGNOSIS — E1169 Type 2 diabetes mellitus with other specified complication: Secondary | ICD-10-CM | POA: Diagnosis not present

## 2020-02-22 DIAGNOSIS — N400 Enlarged prostate without lower urinary tract symptoms: Secondary | ICD-10-CM

## 2020-02-22 DIAGNOSIS — N1831 Chronic kidney disease, stage 3a: Secondary | ICD-10-CM

## 2020-02-22 DIAGNOSIS — N529 Male erectile dysfunction, unspecified: Secondary | ICD-10-CM

## 2020-02-22 DIAGNOSIS — E1122 Type 2 diabetes mellitus with diabetic chronic kidney disease: Secondary | ICD-10-CM

## 2020-02-22 LAB — BAYER DCA HB A1C WAIVED: HB A1C (BAYER DCA - WAIVED): 6.3 % (ref <7.0)

## 2020-02-22 MED ORDER — SILDENAFIL CITRATE 20 MG PO TABS
20.0000 mg | ORAL_TABLET | ORAL | 1 refills | Status: DC | PRN
Start: 2020-02-22 — End: 2020-02-22

## 2020-02-22 MED FILL — SILDENAFIL CITRATE 20 MG TA: 20 | 20 days supply | Qty: 100 | Fill #0

## 2020-02-22 NOTE — Progress Notes (Signed)
BP 123/81   Pulse 74   Temp (!) 97 F (36.1 C)   Ht '5\' 11"'  (1.803 m)   Wt 226 lb (102.5 kg)   SpO2 92%   BMI 31.52 kg/m    Subjective:   Patient ID: Matthew Bolton, male    DOB: Oct 10, 1955, 64 y.o.   MRN: [id number]  HPI: Matthew Bolton is a 64 y.o. male presenting on 02/22/2020 for Medical Management of Chronic Issues and Diabetes   HPI Type 2 diabetes mellitus Patient comes in today for recheck of his diabetes. Patient has been currently taking Metformin and Januvia. Patient is not currently on an ACE inhibitor/ARB. Patient has seen an ophthalmologist this year. Patient denies any issues with their feet. The symptom started onset as an adult hyperlipidemia and CKD stage III ARE RELATED TO DM   Hyperlipidemia Patient is coming in for recheck of his hyperlipidemia. The patient is currently taking atorvastatin. They deny any issues with myalgias or history of liver damage from it. They deny any focal numbness or weakness or chest pain.   Stage III kidney disease recheck Patient is coming in for stage III kidney disease recheck.  Erectile dysfunction and BPH Patient currently uses sildenafil as needed and has been seeing Dr. Alinda Money with urology, due to insurance lapse he needs a new referral for this.  Relevant past medical, surgical, family and social history reviewed and updated as indicated. Interim medical history since our last visit reviewed. Allergies and medications reviewed and updated.  Review of Systems  Constitutional: Negative for chills and fever.  Respiratory: Negative for shortness of breath and wheezing.   Cardiovascular: Negative for chest pain and leg swelling.  Genitourinary: Negative for difficulty urinating, hematuria and urgency.  Musculoskeletal: Negative for back pain and gait problem.  Skin: Negative for rash.  Neurological: Negative for dizziness, weakness and numbness.  All other systems reviewed and are negative.   Per HPI unless  specifically indicated above   Allergies as of 02/22/2020   No Known Allergies     Medication List       Accurate as of February 22, 2020  8:52 AM. If you have any questions, ask your nurse or doctor.        atorvastatin 20 MG tablet Commonly known as: LIPITOR Take 1 tablet (20 mg total) by mouth daily.   colchicine 0.6 MG tablet Day 1: Take 1.2 mg at the first sign of flare, followed in 1 hour with a single dose of 0.6 mg. Then 1 tablet twice daily until gout resolved.   ibuprofen 800 MG tablet Commonly known as: ADVIL Take 1 tablet (800 mg total) by mouth 3 (three) times daily.   metFORMIN 500 MG tablet Commonly known as: GLUCOPHAGE Take 1 tablet (500 mg total) by mouth 2 (two) times daily with a meal.   sildenafil 20 MG tablet Commonly known as: REVATIO Take 1-5 tablets (20-100 mg total) by mouth as needed. Started by: Fransisca Kaufmann Mikiah Demond, MD   sitaGLIPtin 100 MG tablet Commonly known as: JANUVIA Take 100 mg by mouth daily.        Objective:   BP 123/81   Pulse 74   Temp (!) 97 F (36.1 C)   Ht '5\' 11"'  (1.803 m)   Wt 226 lb (102.5 kg)   SpO2 92%   BMI 31.52 kg/m   Wt Readings from Last 3 Encounters:  02/22/20 226 lb (102.5 kg)  11/19/19 220 lb (99.8 kg)  08/18/19 223 lb 2 oz (  101.2 kg)    Physical Exam Vitals and nursing note reviewed.  Constitutional:      General: He is not in acute distress.    Appearance: He is well-developed. He is not diaphoretic.  Eyes:     General: No scleral icterus.    Conjunctiva/sclera: Conjunctivae normal.  Neck:     Thyroid: No thyromegaly.  Cardiovascular:     Rate and Rhythm: Normal rate and regular rhythm.     Heart sounds: Normal heart sounds. No murmur heard.   Pulmonary:     Effort: Pulmonary effort is normal. No respiratory distress.     Breath sounds: Normal breath sounds. No wheezing.  Musculoskeletal:        General: Normal range of motion.     Cervical back: Neck supple.  Lymphadenopathy:      Cervical: No cervical adenopathy.  Skin:    General: Skin is warm and dry.     Findings: No rash.  Neurological:     Mental Status: He is alert and oriented to person, place, and time.     Coordination: Coordination normal.  Psychiatric:        Behavior: Behavior normal.       Assessment & Plan:   Problem List Items Addressed This Visit      Endocrine   Type 2 diabetes mellitus with stage 3a chronic kidney disease, without long-term current use of insulin ([place]) - Primary   Relevant Orders   Bayer DCA Hb A1c Waived   CMP14+EGFR   Hyperlipidemia associated with type 2 diabetes mellitus ([place])   Relevant Orders   Lipid panel     Genitourinary   Stage 3b chronic kidney disease ([place])   Relevant Orders   CBC with Differential/Platelet   CMP14+EGFR    Other Visit Diagnoses    Erectile dysfunction, unspecified erectile dysfunction type       Relevant Orders   Ambulatory referral to Urology   Benign prostatic hyperplasia, unspecified whether lower urinary tract symptoms present       Relevant Orders   Ambulatory referral to Urology    Patient's A1c 6.3 which looks good.  Patient's is to continue current medication and no changes. Patient wants a new referral to go back till [facility] where he has been following with previously.  Follow up plan: Return in about 3 months (around 05/23/2020), or if symptoms worsen or fail to improve, for Diabetes recheck.  Counseling provided for all of the vaccine components Orders Placed This Encounter  Procedures  . Bayer DCA Hb A1c Waived  . CBC with Differential/Platelet  . CMP14+EGFR  . Lipid panel  . Ambulatory referral to Urology    Caryl Pina, MD [facility] 02/22/2020, 8:52 AM

## 2020-02-23 LAB — CMP14+EGFR
ALT: 19 IU/L (ref 0–44)
AST: 15 IU/L (ref 0–40)
Albumin/Globulin Ratio: 1.7 (ref 1.2–2.2)
Albumin: 4.3 g/dL (ref 3.8–4.8)
Alkaline Phosphatase: 79 IU/L (ref 44–121)
BUN/Creatinine Ratio: 17 (ref 10–24)
BUN: 21 mg/dL (ref 8–27)
Bilirubin Total: 0.5 mg/dL (ref 0.0–1.2)
CO2: 21 mmol/L (ref 20–29)
Calcium: 9.5 mg/dL (ref 8.6–10.2)
Chloride: 102 mmol/L (ref 96–106)
Creatinine, Ser: 1.26 mg/dL (ref 0.76–1.27)
GFR calc Af Amer: 70 mL/min/{1.73_m2} (ref >59)
GFR calc non Af Amer: 60 mL/min/{1.73_m2} (ref >59)
Globulin, Total: 2.5 g/dL (ref 1.5–4.5)
Glucose: 121 mg/dL — ABNORMAL HIGH (ref 65–99)
Potassium: 4.6 mmol/L (ref 3.5–5.2)
Sodium: 138 mmol/L (ref 134–144)
Total Protein: 6.8 g/dL (ref 6.0–8.5)

## 2020-02-23 LAB — CBC WITH DIFFERENTIAL/PLATELET
Basophils Absolute: 0.1 10*3/uL (ref 0.0–0.2)
Basos: 1 %
EOS (ABSOLUTE): 0.3 10*3/uL (ref 0.0–0.4)
Eos: 4 %
Hematocrit: 49.7 % (ref 37.5–51.0)
Hemoglobin: 16.8 g/dL (ref 13.0–17.7)
Immature Grans (Abs): 0.1 10*3/uL (ref 0.0–0.1)
Immature Granulocytes: 1 %
Lymphocytes Absolute: 1.8 10*3/uL (ref 0.7–3.1)
Lymphs: 27 %
MCH: 29.5 pg (ref 26.6–33.0)
MCHC: 33.8 g/dL (ref 31.5–35.7)
MCV: 87 fL (ref 79–97)
Monocytes Absolute: 0.6 10*3/uL (ref 0.1–0.9)
Monocytes: 9 %
Neutrophils Absolute: 3.9 10*3/uL (ref 1.4–7.0)
Neutrophils: 58 %
Platelets: 155 10*3/uL (ref 150–450)
RBC: 5.7 x10E6/uL (ref 4.14–5.80)
RDW: 13.6 % (ref 11.6–15.4)
WBC: 6.8 10*3/uL (ref 3.4–10.8)

## 2020-02-23 LAB — LIPID PANEL
Chol/HDL Ratio: 3.3 ratio (ref 0.0–5.0)
Cholesterol, Total: 137 mg/dL (ref 100–199)
HDL: 41 mg/dL
LDL Chol Calc (NIH): 75 mg/dL (ref 0–99)
Triglycerides: 117 mg/dL (ref 0–149)
VLDL Cholesterol Cal: 21 mg/dL (ref 5–40)

## 2020-02-26 ENCOUNTER — Telehealth: Payer: Self-pay

## 2020-02-26 NOTE — Telephone Encounter (Signed)
Pt returned missed call regarding lab results. Reviewed results with pt per Dr Dettingers note. Pt voiced understanding. 

## 2020-02-29 ENCOUNTER — Other Ambulatory Visit: Payer: Self-pay | Admitting: Family Medicine

## 2020-03-01 MED FILL — JANUVIA 100 MG TABLET: 100 | 30 days supply | Qty: 30 | Fill #0

## 2020-04-05 MED FILL — ATORVASTATIN CALCIUM 20 MG: 20 | 90 days supply | Qty: 90 | Fill #2

## 2020-04-05 MED FILL — JANUVIA 100 MG TABLET: 100 | 30 days supply | Qty: 30 | Fill #1

## 2020-04-05 MED FILL — SILDENAFIL CITRATE 20 MG TA: 20 | 20 days supply | Qty: 100 | Fill #1

## 2020-04-28 LAB — HM DIABETES EYE EXAM

## 2020-05-07 MED FILL — JANUVIA 100 MG TABLET: 100 | 30 days supply | Qty: 30 | Fill #2

## 2020-05-25 ENCOUNTER — Other Ambulatory Visit: Payer: Self-pay | Admitting: Family Medicine

## 2020-05-25 ENCOUNTER — Encounter: Payer: Self-pay | Admitting: Family Medicine

## 2020-05-25 ENCOUNTER — Ambulatory Visit (INDEPENDENT_AMBULATORY_CARE_PROVIDER_SITE_OTHER): Payer: No Typology Code available for payment source | Admitting: Family Medicine

## 2020-05-25 ENCOUNTER — Other Ambulatory Visit: Payer: Self-pay

## 2020-05-25 VITALS — BP 115/72 | HR 87 | Ht 71.0 in | Wt 234.0 lb

## 2020-05-25 DIAGNOSIS — E1169 Type 2 diabetes mellitus with other specified complication: Secondary | ICD-10-CM

## 2020-05-25 DIAGNOSIS — E1122 Type 2 diabetes mellitus with diabetic chronic kidney disease: Secondary | ICD-10-CM | POA: Diagnosis not present

## 2020-05-25 DIAGNOSIS — N1832 Chronic kidney disease, stage 3b: Secondary | ICD-10-CM

## 2020-05-25 DIAGNOSIS — B372 Candidiasis of skin and nail: Secondary | ICD-10-CM | POA: Diagnosis not present

## 2020-05-25 DIAGNOSIS — E785 Hyperlipidemia, unspecified: Secondary | ICD-10-CM

## 2020-05-25 DIAGNOSIS — N1831 Chronic kidney disease, stage 3a: Secondary | ICD-10-CM

## 2020-05-25 LAB — BAYER DCA HB A1C WAIVED: HB A1C (BAYER DCA - WAIVED): 7.1 % — ABNORMAL HIGH (ref <7.0)

## 2020-05-25 MED ORDER — METFORMIN HCL 500 MG PO TABS: 500.0000 mg | ORAL_TABLET | Freq: Two times a day (BID) | ORAL | 3 refills | Status: DC

## 2020-05-25 MED ORDER — NYSTATIN 100000 UNIT/GM EX CREA: 1.0000 "application " | TOPICAL_CREAM | Freq: Two times a day (BID) | CUTANEOUS | 0 refills | Status: DC

## 2020-05-25 MED ORDER — FLUCONAZOLE 150 MG PO TABS: 150.0000 mg | ORAL_TABLET | ORAL | 0 refills | Status: DC

## 2020-05-25 MED ORDER — ATORVASTATIN CALCIUM 20 MG PO TABS: 20.0000 mg | ORAL_TABLET | Freq: Every day | ORAL | 3 refills | Status: DC

## 2020-05-25 MED ORDER — SITAGLIPTIN PHOSPHATE 100 MG PO TABS
100.0000 mg | ORAL_TABLET | Freq: Every day | ORAL | 3 refills | Status: DC
Start: 2020-05-25 — End: 2020-05-25

## 2020-05-25 MED FILL — NYSTATIN 100,000 UNIT/GM CR: 100000 | 30 days supply | Qty: 30 | Fill #0

## 2020-05-25 MED FILL — METFORMIN HCL 500 MG TABS: 500 | 90 days supply | Qty: 180 | Fill #0

## 2020-05-25 MED FILL — FLUCONAZOLE 150 MG TABS: 150 | 14 days supply | Qty: 2 | Fill #0

## 2020-05-25 NOTE — Progress Notes (Signed)
BP 115/72   Pulse 87   Ht '5\' 11"'  (1.803 m)   Wt 234 lb (106.1 kg)   SpO2 96%   BMI 32.64 kg/m    Subjective:   Patient ID: Matthew Bolton, male    DOB: [birth year], 65 y.o.   MRN: [id number]  HPI: Matthew Bolton is a 65 y.o. male presenting on 05/25/2020 for Medical Management of Chronic Issues and Diabetes   HPI Type 2 diabetes mellitus Patient comes in today for recheck of his diabetes. Patient has been currently taking Metformin and Januvia. Patient is not currently on an ACE inhibitor/ARB. Patient has seen an ophthalmologist this year. Patient denies any issues with their feet. The symptom started onset as an adult hyperlipidemia and CKD 3 ARE RELATED TO DM   Hyperlipidemia Patient is coming in for recheck of his hyperlipidemia. The patient is currently taking atorvastatin. They deny any issues with myalgias or history of liver damage from it. They deny any focal numbness or weakness or chest pain.   CKD stage III Patient has CKD stage III and is coming in for recheck today.  Patient denies any major issues.  Denies any urinary issues.  Patient is coming in today complaining of mucus around his rectum, he says he will wake up with it in the morning and he will have it throughout the day.  He does not Nestl know if it is coming from his stool because he does not notice it when he stools but it notices it just around his rectum at times.  He says is a white film and its been getting more frequent.  He says he has noticed it over the past year but now he notices it all of the time.  Relevant past medical, surgical, family and social history reviewed and updated as indicated. Interim medical history since our last visit reviewed. Allergies and medications reviewed and updated.  Review of Systems  Constitutional: Negative for chills and fever.  Respiratory: Negative for shortness of breath and wheezing.   Cardiovascular: Negative for chest pain and leg swelling.   Gastrointestinal: Negative for abdominal pain, anal bleeding, blood in stool, constipation, diarrhea and vomiting.  Musculoskeletal: Negative for back pain and gait problem.  Skin: Negative for rash.  All other systems reviewed and are negative.   Per HPI unless specifically indicated above   Allergies as of 05/25/2020   No Known Allergies     Medication List       Accurate as of May 25, 2020  8:50 AM. If you have any questions, ask your nurse or doctor.        atorvastatin 20 MG tablet Commonly known as: LIPITOR Take 1 tablet (20 mg total) by mouth daily.   colchicine 0.6 MG tablet Day 1: Take 1.2 mg at the first sign of flare, followed in 1 hour with a single dose of 0.6 mg. Then 1 tablet twice daily until gout resolved.   ibuprofen 800 MG tablet Commonly known as: ADVIL Take 1 tablet (800 mg total) by mouth 3 (three) times daily.   Januvia 100 MG tablet Generic drug: sitaGLIPtin TAKE 1 TABLET BY MOUTH ONCE A DAY   metFORMIN 500 MG tablet Commonly known as: GLUCOPHAGE Take 1 tablet (500 mg total) by mouth 2 (two) times daily with a meal.   sildenafil 20 MG tablet Commonly known as: REVATIO Take 1-5 tablets (20-100 mg total) by mouth as needed.        Objective:   BP 115/72  Pulse 87   Ht '5\' 11"'  (1.803 m)   Wt 234 lb (106.1 kg)   SpO2 96%   BMI 32.64 kg/m   Wt Readings from Last 3 Encounters:  05/25/20 234 lb (106.1 kg)  02/22/20 226 lb (102.5 kg)  11/19/19 220 lb (99.8 kg)    Physical Exam Vitals and nursing note reviewed.  Constitutional:      General: He is not in acute distress.    Appearance: He is well-developed and well-nourished. He is not diaphoretic.  Eyes:     General: No scleral icterus.    Extraocular Movements: EOM normal.     Conjunctiva/sclera: Conjunctivae normal.  Neck:     Thyroid: No thyromegaly.  Cardiovascular:     Rate and Rhythm: Normal rate and regular rhythm.     Pulses: Intact distal pulses.     Heart sounds:  Normal heart sounds. No murmur heard.   Pulmonary:     Effort: Pulmonary effort is normal. No respiratory distress.     Breath sounds: Normal breath sounds. No wheezing.  Genitourinary:    Prostate: Not enlarged, not tender and no nodules present.     Rectum: No tenderness, external hemorrhoid or internal hemorrhoid.    Musculoskeletal:        General: No edema. Normal range of motion.     Cervical back: Neck supple.  Lymphadenopathy:     Cervical: No cervical adenopathy.  Skin:    General: Skin is warm and dry.     Findings: No rash.  Neurological:     Mental Status: He is alert and oriented to person, place, and time.     Coordination: Coordination normal.  Psychiatric:        Mood and Affect: Mood and affect normal.        Behavior: Behavior normal.     Results for orders placed or performed in visit on 05/11/20  HM DIABETES EYE EXAM  Result Value Ref Range   HM Diabetic Eye Exam No Retinopathy No Retinopathy    Assessment & Plan:   Problem List Items Addressed This Visit      Endocrine   Type 2 diabetes mellitus with stage 3a chronic kidney disease, without long-term current use of insulin (HCC) - Primary   Relevant Medications   metFORMIN (GLUCOPHAGE) 500 MG tablet   sitaGLIPtin (JANUVIA) 100 MG tablet   atorvastatin (LIPITOR) 20 MG tablet   Other Relevant Orders   CBC with Differential/Platelet   CMP14+EGFR   Lipid panel   Bayer DCA Hb A1c Waived   Hyperlipidemia associated with type 2 diabetes mellitus (HCC)   Relevant Medications   metFORMIN (GLUCOPHAGE) 500 MG tablet   sitaGLIPtin (JANUVIA) 100 MG tablet   atorvastatin (LIPITOR) 20 MG tablet   Other Relevant Orders   CBC with Differential/Platelet   CMP14+EGFR   Lipid panel   Bayer DCA Hb A1c Waived     Genitourinary   Stage 3b chronic kidney disease ([place])   Relevant Orders   CBC with Differential/Platelet   CMP14+EGFR   Lipid panel   Bayer DCA Hb A1c Waived    Other Visit Diagnoses     Yeast dermatitis          A1c up slightly, redirect and refocus on eating right, 7.1 so we will not change medicine.  Patient's perianal discharge looks more like a yeast dermatitis we will treat as such.  If not improved may consider urology in the future. Follow up plan: Return in about  3 months (around 08/25/2020), or if symptoms worsen or fail to improve, for Diabetes recheck.  Counseling provided for all of the vaccine components Orders Placed This Encounter  Procedures  . CBC with Differential/Platelet  . CMP14+EGFR  . Lipid panel  . Bayer [facility] Hb A1c [place], MD [facility] 05/25/2020, 8:50 AM

## 2020-05-26 LAB — CBC WITH DIFFERENTIAL/PLATELET
Basophils Absolute: 0.1 10*3/uL (ref 0.0–0.2)
Basos: 1 %
EOS (ABSOLUTE): 0.3 10*3/uL (ref 0.0–0.4)
Eos: 3 %
Hematocrit: 49.4 % (ref 37.5–51.0)
Hemoglobin: 16.8 g/dL (ref 13.0–17.7)
Immature Grans (Abs): 0 10*3/uL (ref 0.0–0.1)
Immature Granulocytes: 0 %
Lymphocytes Absolute: 1.5 10*3/uL (ref 0.7–3.1)
Lymphs: 21 %
MCH: 29.8 pg (ref 26.6–33.0)
MCHC: 34 g/dL (ref 31.5–35.7)
MCV: 88 fL (ref 79–97)
Monocytes Absolute: 0.5 10*3/uL (ref 0.1–0.9)
Monocytes: 7 %
Neutrophils Absolute: 4.9 10*3/uL (ref 1.4–7.0)
Neutrophils: 68 %
Platelets: 145 10*3/uL — ABNORMAL LOW (ref 150–450)
RBC: 5.63 x10E6/uL (ref 4.14–5.80)
RDW: 13.3 % (ref 11.6–15.4)
WBC: 7.3 10*3/uL (ref 3.4–10.8)

## 2020-05-26 LAB — LIPID PANEL
Chol/HDL Ratio: 3.7 ratio (ref 0.0–5.0)
Cholesterol, Total: 146 mg/dL (ref 100–199)
HDL: 39 mg/dL — ABNORMAL LOW (ref >39)
LDL Chol Calc (NIH): 84 mg/dL (ref 0–99)
Triglycerides: 131 mg/dL (ref 0–149)
VLDL Cholesterol Cal: 23 mg/dL (ref 5–40)

## 2020-05-26 LAB — CMP14+EGFR
ALT: 23 IU/L (ref 0–44)
AST: 15 IU/L (ref 0–40)
Albumin/Globulin Ratio: 1.5 (ref 1.2–2.2)
Albumin: 4.2 g/dL (ref 3.8–4.8)
Alkaline Phosphatase: 87 IU/L (ref 44–121)
BUN/Creatinine Ratio: 14 (ref 10–24)
BUN: 19 mg/dL (ref 8–27)
Bilirubin Total: 0.4 mg/dL (ref 0.0–1.2)
CO2: 22 mmol/L (ref 20–29)
Calcium: 9.1 mg/dL (ref 8.6–10.2)
Chloride: 101 mmol/L (ref 96–106)
Creatinine, Ser: 1.32 mg/dL — ABNORMAL HIGH (ref 0.76–1.27)
Globulin, Total: 2.8 g/dL (ref 1.5–4.5)
Glucose: 138 mg/dL — ABNORMAL HIGH (ref 65–99)
Potassium: 4.9 mmol/L (ref 3.5–5.2)
Sodium: 137 mmol/L (ref 134–144)
Total Protein: 7 g/dL (ref 6.0–8.5)
eGFR: 61 mL/min/{1.73_m2} (ref >59)

## 2020-06-16 ENCOUNTER — Other Ambulatory Visit (HOSPITAL_BASED_OUTPATIENT_CLINIC_OR_DEPARTMENT_OTHER): Payer: Self-pay

## 2020-06-20 MED FILL — JANUVIA 100 MG TABLET: 100 | 30 days supply | Qty: 30 | Fill #0

## 2020-07-26 ENCOUNTER — Other Ambulatory Visit (HOSPITAL_COMMUNITY): Payer: Self-pay

## 2020-07-26 MED FILL — Sitagliptin Phosphate Tab 100 MG (Base Equiv): ORAL | 30 days supply | Qty: 30 | Fill #0 | Status: AC

## 2020-07-26 MED FILL — Atorvastatin Calcium Tab 20 MG (Base Equivalent): ORAL | 90 days supply | Qty: 90 | Fill #0 | Status: AC

## 2020-07-27 ENCOUNTER — Other Ambulatory Visit (HOSPITAL_COMMUNITY): Payer: Self-pay

## 2020-08-25 ENCOUNTER — Ambulatory Visit (INDEPENDENT_AMBULATORY_CARE_PROVIDER_SITE_OTHER): Payer: No Typology Code available for payment source | Admitting: Family Medicine

## 2020-08-25 ENCOUNTER — Other Ambulatory Visit: Payer: Self-pay

## 2020-08-25 ENCOUNTER — Encounter: Payer: Self-pay | Admitting: Family Medicine

## 2020-08-25 VITALS — BP 104/64 | HR 79 | Temp 98.2°F | Resp 20 | Ht 71.0 in | Wt 232.0 lb

## 2020-08-25 DIAGNOSIS — E1169 Type 2 diabetes mellitus with other specified complication: Secondary | ICD-10-CM | POA: Diagnosis not present

## 2020-08-25 DIAGNOSIS — E1122 Type 2 diabetes mellitus with diabetic chronic kidney disease: Secondary | ICD-10-CM

## 2020-08-25 DIAGNOSIS — N1831 Chronic kidney disease, stage 3a: Secondary | ICD-10-CM | POA: Diagnosis not present

## 2020-08-25 DIAGNOSIS — N1832 Chronic kidney disease, stage 3b: Secondary | ICD-10-CM

## 2020-08-25 DIAGNOSIS — E785 Hyperlipidemia, unspecified: Secondary | ICD-10-CM

## 2020-08-25 LAB — CMP14+EGFR
ALT: 22 IU/L (ref 0–44)
AST: 22 IU/L (ref 0–40)
Albumin/Globulin Ratio: 1.7 (ref 1.2–2.2)
Albumin: 4.2 g/dL (ref 3.8–4.8)
Alkaline Phosphatase: 89 IU/L (ref 44–121)
BUN/Creatinine Ratio: 15 (ref 10–24)
BUN: 21 mg/dL (ref 8–27)
Bilirubin Total: 0.8 mg/dL (ref 0.0–1.2)
CO2: 21 mmol/L (ref 20–29)
Calcium: 9.3 mg/dL (ref 8.6–10.2)
Chloride: 102 mmol/L (ref 96–106)
Creatinine, Ser: 1.44 mg/dL — ABNORMAL HIGH (ref 0.76–1.27)
Globulin, Total: 2.5 g/dL (ref 1.5–4.5)
Glucose: 147 mg/dL — ABNORMAL HIGH (ref 65–99)
Potassium: 4.6 mmol/L (ref 3.5–5.2)
Sodium: 137 mmol/L (ref 134–144)
Total Protein: 6.7 g/dL (ref 6.0–8.5)
eGFR: 55 mL/min/{1.73_m2} — ABNORMAL LOW (ref >59)

## 2020-08-25 LAB — CBC WITH DIFFERENTIAL/PLATELET
Basophils Absolute: 0.1 10*3/uL (ref 0.0–0.2)
Basos: 1 %
EOS (ABSOLUTE): 0.3 10*3/uL (ref 0.0–0.4)
Eos: 4 %
Hematocrit: 46.5 % (ref 37.5–51.0)
Hemoglobin: 15.9 g/dL (ref 13.0–17.7)
Immature Grans (Abs): 0 10*3/uL (ref 0.0–0.1)
Immature Granulocytes: 0 %
Lymphocytes Absolute: 1.6 10*3/uL (ref 0.7–3.1)
Lymphs: 23 %
MCH: 30.6 pg (ref 26.6–33.0)
MCHC: 34.2 g/dL (ref 31.5–35.7)
MCV: 90 fL (ref 79–97)
Monocytes Absolute: 0.5 10*3/uL (ref 0.1–0.9)
Monocytes: 8 %
Neutrophils Absolute: 4.7 10*3/uL (ref 1.4–7.0)
Neutrophils: 64 %
Platelets: 149 10*3/uL — ABNORMAL LOW (ref 150–450)
RBC: 5.19 x10E6/uL (ref 4.14–5.80)
RDW: 13.8 % (ref 11.6–15.4)
WBC: 7.2 10*3/uL (ref 3.4–10.8)

## 2020-08-25 LAB — BAYER DCA HB A1C WAIVED: HB A1C (BAYER DCA - WAIVED): 7.2 % — ABNORMAL HIGH (ref <7.0)

## 2020-08-25 NOTE — Progress Notes (Signed)
BP 104/64   Pulse 79   Temp 98.2 F (36.8 C)   Resp 20   Ht _0  (1.803 m)   Wt 232 lb (105.2 kg)   SpO2 95%   BMI 32.36 kg/m    Subjective:   Patient ID: Matthew Bolton, male    DOB: Apr 22, 1955, 65 y.o.   MRN: [id number]  HPI: Matthew Bolton is a 65 y.o. male presenting on 08/25/2020 for Medical Management of Chronic Issues (3 mo )   HPI Type 2 diabetes mellitus Patient comes in today for recheck of his diabetes. Patient has been currently taking metformin and Januvia, A1c 7.2 today. Patient is not currently on an ACE inhibitor/ARB. Patient has not seen an ophthalmologist this year. Patient denies any issues with their feet. The symptom started onset as an adult hyperlipidemia and CKD stage III ARE RELATED TO DM   Hyperlipidemia Patient is coming in for recheck of his hyperlipidemia. The patient is currently taking atorvastatin. They deny any issues with myalgias or history of liver damage from it. They deny any focal numbness or weakness or chest pain.   Stage III CKD. Patient is stable and we are monitoring, we will recheck and recheck urine today.  Patient has very low blood pressure so is not on an ACE inhibitor  Relevant past medical, surgical, family and social history reviewed and updated as indicated. Interim medical history since our last visit reviewed. Allergies and medications reviewed and updated.  Review of Systems  Constitutional: Negative for chills and fever.  Eyes: Negative for visual disturbance.  Respiratory: Negative for shortness of breath and wheezing.   Cardiovascular: Negative for chest pain and leg swelling.  Musculoskeletal: Negative for back pain and gait problem.  Skin: Negative for rash.  Neurological: Negative for dizziness, weakness and light-headedness.  All other systems reviewed and are negative.   Per HPI unless specifically indicated above   Allergies as of 08/25/2020   No Known Allergies     Medication List        Accurate as of August 25, 2020  8:54 AM. If you have any questions, ask your nurse or doctor.        STOP taking these medications   fluconazole 150 MG tablet Commonly known as: DIFLUCAN Stopped by: Fransisca Kaufmann Abimael Zeiter, MD     TAKE these medications   atorvastatin 20 MG tablet Commonly known as: LIPITOR TAKE 1 TABLET BY MOUTH ONCE A DAY   colchicine 0.6 MG tablet Day 1: Take 1.2 mg at the first sign of flare, followed in 1 hour with a single dose of 0.6 mg. Then 1 tablet twice daily until gout resolved.   ibuprofen 800 MG tablet Commonly known as: ADVIL Take 1 tablet (800 mg total) by mouth 3 (three) times daily.   Januvia 100 MG tablet Generic drug: sitaGLIPtin TAKE 1 TABLET BY MOUTH ONCE A DAY   metFORMIN 500 MG tablet Commonly known as: GLUCOPHAGE TAKE 1 TABLET BY MOUTH 2 TIMES DAILY WITH A MEAL   nystatin cream Commonly known as: MYCOSTATIN APPLY 1 APPLICATION TOPICALLY 2 TIMES DAILY.   sildenafil 20 MG tablet Commonly known as: REVATIO TAKE 1-5 TABLETS BY MOUTH AS NEEDED        Objective:   BP 104/64   Pulse 79   Temp 98.2 F (36.8 C)   Resp 20   Ht _1  (1.803 m)   Wt 232 lb (105.2 kg)   SpO2 95%   BMI 32.36 kg/m  Wt Readings from Last 3 Encounters:  08/25/20 232 lb (105.2 kg)  05/25/20 234 lb (106.1 kg)  02/22/20 226 lb (102.5 kg)    Physical Exam Vitals and nursing note reviewed.  Constitutional:      General: He is not in acute distress.    Appearance: He is well-developed. He is not diaphoretic.  Eyes:     General: No scleral icterus.    Conjunctiva/sclera: Conjunctivae normal.  Neck:     Thyroid: No thyromegaly.  Cardiovascular:     Rate and Rhythm: Normal rate and regular rhythm.     Heart sounds: Normal heart sounds. No murmur heard.   Pulmonary:     Effort: Pulmonary effort is normal. No respiratory distress.     Breath sounds: Normal breath sounds. No wheezing.  Musculoskeletal:        General: Normal range of motion.      Cervical back: Neck supple.  Lymphadenopathy:     Cervical: No cervical adenopathy.  Skin:    General: Skin is warm and dry.     Findings: No rash.  Neurological:     Mental Status: He is alert and oriented to person, place, and time.     Coordination: Coordination normal.  Psychiatric:        Behavior: Behavior normal.       Assessment & Plan:   Problem List Items Addressed This Visit      Endocrine   Type 2 diabetes mellitus with stage 3a chronic kidney disease, without long-term current use of insulin ([place]) - Primary   Relevant Orders   Bayer DCA Hb A1c Waived   CBC with Differential/Platelet   Microalbumin / creatinine urine ratio   Hyperlipidemia associated with type 2 diabetes mellitus ([place])   Relevant Orders   CBC with Differential/Platelet     Genitourinary   Stage 3b chronic kidney disease ([place])   Relevant Orders   CBC with Differential/Platelet   CMP14+EGFR      Patient has very low blood pressure that is why is not on an ACE inhibitor for his CKD with diabetes.  We will continue to monitor closely.  A1c 7.2 so mainly focus on diet to bring it back down. Follow up plan: Return in about 3 months (around 11/25/2020), or if symptoms worsen or fail to improve, for Diabetes recheck.  Counseling provided for all of the vaccine components Orders Placed This Encounter  Procedures  . Bayer DCA Hb A1c Waived  . CBC with Differential/Platelet  . CMP14+EGFR  . Microalbumin / creatinine urine ratio    Caryl Pina, MD [facility] 08/25/2020, 8:54 AM

## 2020-08-29 ENCOUNTER — Other Ambulatory Visit (HOSPITAL_COMMUNITY): Payer: Self-pay

## 2020-08-29 MED FILL — Sitagliptin Phosphate Tab 100 MG (Base Equiv): ORAL | 30 days supply | Qty: 30 | Fill #1 | Status: AC

## 2020-09-09 ENCOUNTER — Encounter: Payer: Self-pay | Admitting: Family Medicine

## 2020-09-09 ENCOUNTER — Ambulatory Visit (INDEPENDENT_AMBULATORY_CARE_PROVIDER_SITE_OTHER): Payer: No Typology Code available for payment source | Admitting: Family Medicine

## 2020-09-09 ENCOUNTER — Other Ambulatory Visit: Payer: Self-pay

## 2020-09-09 ENCOUNTER — Other Ambulatory Visit (HOSPITAL_COMMUNITY): Payer: Self-pay

## 2020-09-09 VITALS — BP 95/64 | HR 85 | Ht 71.0 in | Wt 242.0 lb

## 2020-09-09 DIAGNOSIS — E1122 Type 2 diabetes mellitus with diabetic chronic kidney disease: Secondary | ICD-10-CM | POA: Diagnosis not present

## 2020-09-09 DIAGNOSIS — E785 Hyperlipidemia, unspecified: Secondary | ICD-10-CM

## 2020-09-09 DIAGNOSIS — E1169 Type 2 diabetes mellitus with other specified complication: Secondary | ICD-10-CM

## 2020-09-09 DIAGNOSIS — N1831 Chronic kidney disease, stage 3a: Secondary | ICD-10-CM | POA: Diagnosis not present

## 2020-09-09 DIAGNOSIS — N1832 Chronic kidney disease, stage 3b: Secondary | ICD-10-CM | POA: Diagnosis not present

## 2020-09-09 MED ORDER — SILDENAFIL CITRATE 20 MG PO TABS
20.0000 mg | ORAL_TABLET | Freq: Every day | ORAL | 1 refills | Status: DC | PRN
  Filled 2020-09-09 – 2020-09-14 (×2): qty 30, 7d supply, fill #0
  Filled 2020-12-01: qty 30, 7d supply, fill #1

## 2020-09-09 NOTE — Progress Notes (Signed)
BP 95/64   Pulse 85   Ht '5\' 11"'  (1.803 m)   Wt 242 lb (109.8 kg)   SpO2 95%   BMI 33.75 kg/m    Subjective:   Patient ID: Matthew Bolton, male    DOB: 05-07-1955, 65 y.o.   MRN: [id number]  HPI: Matthew Bolton is a 65 y.o. male presenting on 09/09/2020 for Medical Management of Chronic Issues (CPE) and Diabetes   HPI Type 2 diabetes mellitus Patient comes in today for recheck of his diabetes. Patient has been currently taking metformin and [place]. Patient is not currently on an ACE inhibitor/ARB. Patient has not seen an ophthalmologist this year. Patient denies any issues with their feet. The symptom started onset as an adult hld and CKD ARE RELATED TO DM   Hyperlipidemia Patient is coming in for recheck of his hyperlipidemia. The patient is currently taking lipitor. They deny any issues with myalgias or history of liver damage from it. They deny any focal numbness or weakness or chest pain.   Stage 3 ckd  patient has stage III CKD and it has been stable where to keep an eye on it, it was slightly up this last time so recommended increasing hydration.  His blood pressure is too low so we cannot start an ACE inhibitor.  [name] Patient has erectile dysfunction, has difficulty starting erections and would like to try some Viagra for it, sent generic Viagra for the patient.  Relevant past medical, surgical, family and social history reviewed and updated as indicated. Interim medical history since our last visit reviewed. Allergies and medications reviewed and updated.  Review of Systems  Constitutional:  Negative for chills and fever.  Respiratory:  Negative for shortness of breath and wheezing.   Cardiovascular:  Negative for chest pain and leg swelling.  Musculoskeletal:  Negative for back pain and gait problem.  Skin:  Negative for rash.  Neurological:  Negative for dizziness and light-headedness.  All other systems reviewed and are negative.  Per HPI unless specifically  indicated above   Allergies as of 09/09/2020   No Known Allergies      Medication List        Accurate as of September 09, 2020  3:11 PM. If you have any questions, ask your nurse or doctor.          atorvastatin 20 MG tablet Commonly known as: LIPITOR TAKE 1 TABLET BY MOUTH ONCE A DAY   colchicine 0.6 MG tablet Day 1: Take 1.2 mg at the first sign of flare, followed in 1 hour with a single dose of 0.6 mg. Then 1 tablet twice daily until gout resolved.   ibuprofen 800 MG tablet Commonly known as: ADVIL Take 1 tablet (800 mg total) by mouth 3 (three) times daily.   Januvia 100 MG tablet Generic drug: sitaGLIPtin TAKE 1 TABLET BY MOUTH ONCE A DAY   metFORMIN 500 MG tablet Commonly known as: GLUCOPHAGE TAKE 1 TABLET BY MOUTH 2 TIMES DAILY WITH A MEAL   nystatin cream Commonly known as: MYCOSTATIN APPLY 1 APPLICATION TOPICALLY 2 TIMES DAILY.   sildenafil 20 MG tablet Commonly known as: REVATIO Take 1-4 tablets (20-80 mg total) by mouth daily as needed. What changed:  how much to take how to take this when to take this reasons to take this Changed by: Fransisca Kaufmann Rickey Sadowski, MD         Objective:   BP 95/64   Pulse 85   Ht '5\' 11"'  (1.803 m)  Wt 242 lb (109.8 kg)   SpO2 95%   BMI 33.75 kg/m   Wt Readings from Last 3 Encounters:  09/09/20 242 lb (109.8 kg)  08/25/20 232 lb (105.2 kg)  05/25/20 234 lb (106.1 kg)    Physical Exam Vitals and nursing note reviewed.  Constitutional:      General: He is not in acute distress.    Appearance: He is well-developed. He is not diaphoretic.  Eyes:     General: No scleral icterus.    Conjunctiva/sclera: Conjunctivae normal.  Neck:     Thyroid: No thyromegaly.  Cardiovascular:     Rate and Rhythm: Normal rate and regular rhythm.     Heart sounds: Normal heart sounds. No murmur heard. Pulmonary:     Effort: Pulmonary effort is normal. No respiratory distress.     Breath sounds: Normal breath sounds. No wheezing.   Musculoskeletal:     Cervical back: Neck supple.  Lymphadenopathy:     Cervical: No cervical adenopathy.  Skin:    General: Skin is warm and dry.     Findings: No rash.  Neurological:     Mental Status: He is alert and oriented to person, place, and time.     Coordination: Coordination normal.  Psychiatric:        Behavior: Behavior normal.    Results for orders placed or performed in visit on 08/25/20  Bayer DCA Hb A1c Waived  Result Value Ref Range   HB A1C (BAYER DCA - WAIVED) 7.2 (H) <7.0 %  CBC with Differential/Platelet  Result Value Ref Range   WBC 7.2 3.4 - 10.8 x10E3/uL   RBC 5.19 4.14 - 5.80 x10E6/uL   Hemoglobin 15.9 13.0 - 17.7 g/dL   Hematocrit 46.5 37.5 - 51.0 %   MCV 90 79 - 97 fL   MCH 30.6 26.6 - 33.0 pg   MCHC 34.2 31.5 - 35.7 g/dL   RDW 13.8 11.6 - 15.4 %   Platelets 149 (L) 150 - 450 x10E3/uL   Neutrophils 64 Not Estab. %   Lymphs 23 Not Estab. %   Monocytes 8 Not Estab. %   Eos 4 Not Estab. %   Basos 1 Not Estab. %   Neutrophils Absolute 4.7 1.4 - 7.0 x10E3/uL   Lymphocytes Absolute 1.6 0.7 - 3.1 x10E3/uL   Monocytes Absolute 0.5 0.1 - 0.9 x10E3/uL   EOS (ABSOLUTE) 0.3 0.0 - 0.4 x10E3/uL   Basophils Absolute 0.1 0.0 - 0.2 x10E3/uL   Immature Granulocytes 0 Not Estab. %   Immature Grans (Abs) 0.0 0.0 - 0.1 x10E3/uL  CMP14+EGFR  Result Value Ref Range   Glucose 147 (H) 65 - 99 mg/dL   BUN 21 8 - 27 mg/dL   Creatinine, Ser 1.44 (H) 0.76 - 1.27 mg/dL   eGFR 55 (L) >59 mL/min/1.73   BUN/Creatinine Ratio 15 10 - 24   Sodium 137 134 - 144 mmol/L   Potassium 4.6 3.5 - 5.2 mmol/L   Chloride 102 96 - 106 mmol/L   CO2 21 20 - 29 mmol/L   Calcium 9.3 8.6 - 10.2 mg/dL   Total Protein 6.7 6.0 - 8.5 g/dL   Albumin 4.2 3.8 - 4.8 g/dL   Globulin, Total 2.5 1.5 - 4.5 g/dL   Albumin/Globulin Ratio 1.7 1.2 - 2.2   Bilirubin Total 0.8 0.0 - 1.2 mg/dL   Alkaline Phosphatase 89 44 - 121 IU/L   AST 22 0 - 40 IU/L   ALT 22 0 - 44 IU/L  Assessment & Plan:    Problem List Items Addressed This Visit       Endocrine   Type 2 diabetes mellitus with stage 3a chronic kidney disease, without long-term current use of insulin ([place]) - Primary   Hyperlipidemia associated with type 2 diabetes mellitus ([place])     Genitourinary   Stage 3b chronic kidney disease ([place])  Continue current medication, will start Viagra for him, no other changes.  A1c looks good.  Follow up plan: Return in about 3 months (around 12/10/2020), or if symptoms worsen or fail to improve, for diabetes and hld.  Counseling provided for all of the vaccine components No orders of the defined types were placed in this encounter.   Caryl Pina, MD [facility] 09/09/2020, 3:11 PM

## 2020-09-14 ENCOUNTER — Other Ambulatory Visit (HOSPITAL_COMMUNITY): Payer: Self-pay

## 2020-10-16 MED FILL — Sitagliptin Phosphate Tab 100 MG (Base Equiv): ORAL | 30 days supply | Qty: 30 | Fill #2 | Status: AC

## 2020-10-17 ENCOUNTER — Other Ambulatory Visit (HOSPITAL_COMMUNITY): Payer: Self-pay

## 2020-11-28 ENCOUNTER — Other Ambulatory Visit (HOSPITAL_COMMUNITY): Payer: Self-pay

## 2020-11-28 MED FILL — Atorvastatin Calcium Tab 20 MG (Base Equivalent): ORAL | 90 days supply | Qty: 90 | Fill #1 | Status: AC

## 2020-11-28 MED FILL — Sitagliptin Phosphate Tab 100 MG (Base Equiv): ORAL | 30 days supply | Qty: 30 | Fill #3 | Status: CN

## 2020-11-29 ENCOUNTER — Other Ambulatory Visit (HOSPITAL_COMMUNITY): Payer: Self-pay

## 2020-11-30 ENCOUNTER — Encounter: Payer: Self-pay | Admitting: Family Medicine

## 2020-11-30 ENCOUNTER — Other Ambulatory Visit: Payer: Self-pay

## 2020-11-30 ENCOUNTER — Other Ambulatory Visit (HOSPITAL_COMMUNITY): Payer: Self-pay

## 2020-11-30 ENCOUNTER — Ambulatory Visit (INDEPENDENT_AMBULATORY_CARE_PROVIDER_SITE_OTHER): Payer: No Typology Code available for payment source | Admitting: Family Medicine

## 2020-11-30 VITALS — BP 119/73 | HR 73 | Ht 71.0 in | Wt 232.0 lb

## 2020-11-30 DIAGNOSIS — E785 Hyperlipidemia, unspecified: Secondary | ICD-10-CM

## 2020-11-30 DIAGNOSIS — E1122 Type 2 diabetes mellitus with diabetic chronic kidney disease: Secondary | ICD-10-CM

## 2020-11-30 DIAGNOSIS — N1832 Chronic kidney disease, stage 3b: Secondary | ICD-10-CM | POA: Diagnosis not present

## 2020-11-30 DIAGNOSIS — E1169 Type 2 diabetes mellitus with other specified complication: Secondary | ICD-10-CM | POA: Diagnosis not present

## 2020-11-30 DIAGNOSIS — B372 Candidiasis of skin and nail: Secondary | ICD-10-CM

## 2020-11-30 DIAGNOSIS — N1831 Chronic kidney disease, stage 3a: Secondary | ICD-10-CM

## 2020-11-30 LAB — CBC WITH DIFFERENTIAL/PLATELET
Basophils Absolute: 0.1 10*3/uL (ref 0.0–0.2)
Basos: 1 %
EOS (ABSOLUTE): 0.3 10*3/uL (ref 0.0–0.4)
Eos: 4 %
Hematocrit: 48.8 % (ref 37.5–51.0)
Hemoglobin: 16.4 g/dL (ref 13.0–17.7)
Immature Grans (Abs): 0 10*3/uL (ref 0.0–0.1)
Immature Granulocytes: 0 %
Lymphocytes Absolute: 1.6 10*3/uL (ref 0.7–3.1)
Lymphs: 23 %
MCH: 29.9 pg (ref 26.6–33.0)
MCHC: 33.6 g/dL (ref 31.5–35.7)
MCV: 89 fL (ref 79–97)
Monocytes Absolute: 0.6 10*3/uL (ref 0.1–0.9)
Monocytes: 8 %
Neutrophils Absolute: 4.5 10*3/uL (ref 1.4–7.0)
Neutrophils: 64 %
Platelets: 135 10*3/uL — ABNORMAL LOW (ref 150–450)
RBC: 5.48 x10E6/uL (ref 4.14–5.80)
RDW: 13.3 % (ref 11.6–15.4)
WBC: 7 10*3/uL (ref 3.4–10.8)

## 2020-11-30 LAB — BMP8+EGFR
BUN/Creatinine Ratio: 12 (ref 10–24)
BUN: 16 mg/dL (ref 8–27)
CO2: 24 mmol/L (ref 20–29)
Calcium: 9.3 mg/dL (ref 8.6–10.2)
Chloride: 100 mmol/L (ref 96–106)
Creatinine, Ser: 1.37 mg/dL — ABNORMAL HIGH (ref 0.76–1.27)
Glucose: 155 mg/dL — ABNORMAL HIGH (ref 65–99)
Potassium: 5 mmol/L (ref 3.5–5.2)
Sodium: 138 mmol/L (ref 134–144)
eGFR: 58 mL/min/{1.73_m2} — ABNORMAL LOW (ref >59)

## 2020-11-30 LAB — LIPID PANEL
Chol/HDL Ratio: 4.1 ratio (ref 0.0–5.0)
Cholesterol, Total: 153 mg/dL (ref 100–199)
HDL: 37 mg/dL — ABNORMAL LOW (ref >39)
LDL Chol Calc (NIH): 88 mg/dL (ref 0–99)
Triglycerides: 163 mg/dL — ABNORMAL HIGH (ref 0–149)
VLDL Cholesterol Cal: 28 mg/dL (ref 5–40)

## 2020-11-30 LAB — BAYER DCA HB A1C WAIVED: HB A1C (BAYER DCA - WAIVED): 7.2 % — ABNORMAL HIGH (ref 4.8–5.6)

## 2020-11-30 MED ORDER — NYSTATIN 100000 UNIT/GM EX CREA
1.0000 | TOPICAL_CREAM | Freq: Every day | CUTANEOUS | 1 refills | Status: DC
Start: 2020-11-30 — End: 2021-10-26
  Filled 2020-11-30: qty 30, 30d supply, fill #0

## 2020-11-30 MED ORDER — NYSTATIN 100000 UNIT/GM EX POWD
1.0000 "application " | Freq: Three times a day (TID) | CUTANEOUS | 1 refills | Status: DC
  Filled 2020-11-30: qty 60, 20d supply, fill #0

## 2020-11-30 NOTE — Progress Notes (Signed)
BP 119/73   Pulse 73   Ht _0  (1.803 m)   Wt 232 lb (105.2 kg)   SpO2 95%   BMI 32.36 kg/m    Subjective:   Patient ID: Matthew Bolton, male    DOB: December 09, 1955, 65 y.o.   MRN: [id number]  HPI: Matthew Bolton is a 65 y.o. male presenting on 11/30/2020 for Medical Management of Chronic Issues, Diabetes, and Chronic Kidney Disease   HPI Type 2 diabetes mellitus Patient comes in today for recheck of his diabetes. Patient has been currently taking [place] and metformin and A1c is 7.2. Patient is not currently on an ACE inhibitor/ARB. Patient has not seen an ophthalmologist this year. Patient denies any issues with their feet. The symptom started onset as an adult hyperlipidemia and CKD 3 ARE RELATED TO DM   Hyperlipidemia Patient is coming in for recheck of his hyperlipidemia. The patient is currently taking atorvastatin. They deny any issues with myalgias or history of liver damage from it. They deny any focal numbness or weakness or chest pain.   Patient is coming in complaining of a rash in his groin that is very irritated and itchy.  He says is been going on for more than a few weeks.  Says it is very red and he was concerned about it being his psoriasis or something else mild to have it looked at.  He has been using some over-the-counter moisturizer but it has not been helping.  He has been trying to dry out with his hair dryer and has not helping either.  Relevant past medical, surgical, family and social history reviewed and updated as indicated. Interim medical history since our last visit reviewed. Allergies and medications reviewed and updated.  Review of Systems  Constitutional:  Negative for chills and fever.  Respiratory:  Negative for shortness of breath and wheezing.   Cardiovascular:  Negative for chest pain and leg swelling.  Skin:  Positive for color change and rash.  Neurological:  Negative for dizziness, light-headedness and headaches.  All other systems  reviewed and are negative.  Per HPI unless specifically indicated above   Allergies as of 11/30/2020   No Known Allergies      Medication List        Accurate as of November 30, 2020  9:28 AM. If you have any questions, ask your nurse or doctor.          STOP taking these medications    ibuprofen 800 MG tablet Commonly known as: ADVIL Stopped by: Fransisca Kaufmann Dontreal Miera, MD       TAKE these medications    acetaminophen 325 MG tablet Commonly known as: TYLENOL Take 650 mg by mouth every 6 (six) hours as needed.   atorvastatin 20 MG tablet Commonly known as: LIPITOR TAKE 1 TABLET BY MOUTH ONCE A DAY   colchicine 0.6 MG tablet Day 1: Take 1.2 mg at the first sign of flare, followed in 1 hour with a single dose of 0.6 mg. Then 1 tablet twice daily until gout resolved.   Januvia 100 MG tablet Generic drug: sitaGLIPtin TAKE 1 TABLET BY MOUTH ONCE A DAY   metFORMIN 500 MG tablet Commonly known as: GLUCOPHAGE TAKE 1 TABLET BY MOUTH 2 TIMES DAILY WITH A MEAL   nystatin cream Commonly known as: MYCOSTATIN Apply 1 application topically at bedtime. What changed:  how much to take when to take this Changed by: Worthy Rancher, MD   nystatin powder Commonly known as: MYCOSTATIN/NYSTOP  Apply 1 application topically 3 (three) times daily. What changed: You were already taking a medication with the same name, and this prescription was added. Make sure you understand how and when to take each. Changed by: Fransisca Kaufmann Cleotilde Spadaccini, MD   sildenafil 20 MG tablet Commonly known as: REVATIO Take 1-4 tablets (20-80 mg total) by mouth daily as needed.         Objective:   BP 119/73   Pulse 73   Ht _0  (1.803 m)   Wt 232 lb (105.2 kg)   SpO2 95%   BMI 32.36 kg/m   Wt Readings from Last 3 Encounters:  11/30/20 232 lb (105.2 kg)  09/09/20 242 lb (109.8 kg)  08/25/20 232 lb (105.2 kg)    Physical Exam Vitals and nursing note reviewed.  Constitutional:       General: He is not in acute distress.    Appearance: He is well-developed. He is not diaphoretic.  Eyes:     General: No scleral icterus.    Conjunctiva/sclera: Conjunctivae normal.  Neck:     Thyroid: No thyromegaly.  Cardiovascular:     Rate and Rhythm: Normal rate and regular rhythm.     Heart sounds: Normal heart sounds. No murmur heard. Pulmonary:     Effort: Pulmonary effort is normal. No respiratory distress.     Breath sounds: Normal breath sounds. No wheezing.  Musculoskeletal:        General: Normal range of motion.     Cervical back: Neck supple.  Lymphadenopathy:     Cervical: No cervical adenopathy.  Skin:    General: Skin is warm and dry.     Findings: Rash (Raised pink irritated skin on both inguinal folds.  Consistent with yeast dermatitis likely.  No plaques that would be consistent with his psoriasis) present.  Neurological:     Mental Status: He is alert and oriented to person, place, and time.     Coordination: Coordination normal.  Psychiatric:        Behavior: Behavior normal.      Assessment & Plan:   Problem List Items Addressed This Visit       Endocrine   Type 2 diabetes mellitus with stage 3a chronic kidney disease, without long-term current use of insulin ([place]) - Primary   Relevant Orders   Bayer DCA Hb A1c Waived   Hyperlipidemia associated with type 2 diabetes mellitus ([place])     Genitourinary   Stage 3b chronic kidney disease ([place])   Relevant Orders   Bayer DCA Hb A1c Waived   BMP8+EGFR   CBC with Differential/Platelet   Lipid panel   Other Visit Diagnoses     Yeast dermatitis       Relevant Medications   nystatin cream (MYCOSTATIN)   nystatin (MYCOSTATIN/NYSTOP) powder       A1c looks good but needs improvement at 7.2, focus on diet.  No change in medication.  Gave cream and powder to do for rash in groin, likely yeast dermatitis. Follow up plan: Return in about 3 months (around 03/01/2021), or if symptoms worsen or fail to  improve, for Diabetes and cholesterol.  Counseling provided for all of the vaccine components Orders Placed This Encounter  Procedures   Bayer [place] Hb A1c Waived   BMP8+EGFR   CBC with Differential/Platelet   Lipid panel    Caryl Pina, MD [facility] 11/30/2020, 9:28 AM

## 2020-12-01 ENCOUNTER — Other Ambulatory Visit (HOSPITAL_COMMUNITY): Payer: Self-pay

## 2020-12-01 MED FILL — Sitagliptin Phosphate Tab 100 MG (Base Equiv): ORAL | 30 days supply | Qty: 30 | Fill #3 | Status: AC

## 2020-12-29 ENCOUNTER — Other Ambulatory Visit (HOSPITAL_COMMUNITY): Payer: Self-pay

## 2020-12-29 MED FILL — Sitagliptin Phosphate Tab 100 MG (Base Equiv): ORAL | 30 days supply | Qty: 30 | Fill #4 | Status: AC

## 2021-01-17 ENCOUNTER — Ambulatory Visit: Payer: No Typology Code available for payment source

## 2021-01-27 ENCOUNTER — Other Ambulatory Visit: Payer: Self-pay

## 2021-01-27 ENCOUNTER — Ambulatory Visit (INDEPENDENT_AMBULATORY_CARE_PROVIDER_SITE_OTHER): Payer: No Typology Code available for payment source

## 2021-01-27 DIAGNOSIS — Z23 Encounter for immunization: Secondary | ICD-10-CM | POA: Diagnosis not present

## 2021-02-06 ENCOUNTER — Other Ambulatory Visit (HOSPITAL_COMMUNITY): Payer: Self-pay

## 2021-02-06 MED FILL — Sitagliptin Phosphate Tab 100 MG (Base Equiv): ORAL | 30 days supply | Qty: 30 | Fill #5 | Status: AC

## 2021-03-02 ENCOUNTER — Ambulatory Visit (INDEPENDENT_AMBULATORY_CARE_PROVIDER_SITE_OTHER): Payer: No Typology Code available for payment source | Admitting: Family Medicine

## 2021-03-02 ENCOUNTER — Encounter: Payer: Self-pay | Admitting: Family Medicine

## 2021-03-02 ENCOUNTER — Other Ambulatory Visit (HOSPITAL_COMMUNITY): Payer: Self-pay

## 2021-03-02 DIAGNOSIS — Z20828 Contact with and (suspected) exposure to other viral communicable diseases: Secondary | ICD-10-CM | POA: Diagnosis not present

## 2021-03-02 MED ORDER — OSELTAMIVIR PHOSPHATE 75 MG PO CAPS
75.0000 mg | ORAL_CAPSULE | Freq: Two times a day (BID) | ORAL | 0 refills | Status: DC
Start: 2021-03-02 — End: 2021-04-12
  Filled 2021-03-02: qty 10, 5d supply, fill #0

## 2021-03-02 NOTE — Progress Notes (Signed)
Virtual Visit via telephone Note  I connected with Matthew Bolton on 03/02/21 at 1341 by telephone and verified that I am speaking with the correct person using two identifiers. Matthew Bolton is currently located at home and patient are currently with her during visit. The provider, Elige Radon Aireonna Bauer, MD is located in their office at time of visit.  Call ended at 1347  I discussed the limitations, risks, security and privacy concerns of performing an evaluation and management service by telephone and the availability of in person appointments. I also discussed with the patient that there may be a patient responsible charge related to this service. The patient expressed understanding and agreed to proceed.   History and Present Illness: Patient is calling in for exposure to flu.  His daughter and grand kids have been ill over the past 4 days.  He had been around them and they came back flu positive. He is not having symptoms and is worried because he is higher risk.  He denies fever or chills or SOB.  He is vaccinated for the flu. He denies fever or headache or chills or SOB.   1. Exposure to the flu     Outpatient Encounter Medications as of 03/02/2021  Medication Sig   oseltamivir (TAMIFLU) 75 MG capsule Take 1 capsule (75 mg total) by mouth 2 (two) times daily.   acetaminophen (TYLENOL) 325 MG tablet Take 650 mg by mouth every 6 (six) hours as needed.   atorvastatin (LIPITOR) 20 MG tablet TAKE 1 TABLET BY MOUTH ONCE A DAY   cholecalciferol (VITAMIN D3) 25 MCG (1000 UNIT) tablet Take 1,000 Units by mouth daily.   colchicine 0.6 MG tablet Day 1: Take 1.2 mg at the first sign of flare, followed in 1 hour with a single dose of 0.6 mg. Then 1 tablet twice daily until gout resolved.   metFORMIN (GLUCOPHAGE) 500 MG tablet TAKE 1 TABLET BY MOUTH 2 TIMES DAILY WITH A MEAL   nystatin (MYCOSTATIN/NYSTOP) powder Apply 1 application topically 3 (three) times daily.   nystatin cream  (MYCOSTATIN) Apply 1 application topically at bedtime.   sildenafil (REVATIO) 20 MG tablet Take 1 to 4 tablets (20-80 mg total) by mouth daily as needed.   sitaGLIPtin (JANUVIA) 100 MG tablet TAKE 1 TABLET BY MOUTH ONCE A DAY   zinc gluconate 50 MG tablet Take 50 mg by mouth daily.   No facility-administered encounter medications on file as of 03/02/2021.    Review of Systems  Constitutional:  Negative for chills and fever.  Eyes:  Negative for visual disturbance.  Respiratory:  Negative for shortness of breath and wheezing.   Cardiovascular:  Negative for chest pain and leg swelling.  Musculoskeletal:  Negative for back pain and gait problem.  Skin:  Negative for rash.  Neurological:  Negative for dizziness, weakness and light-headedness.  All other systems reviewed and are negative.  Observations/Objective: Patient sounds comfortable and in no acute distress  Assessment and Plan: Problem List Items Addressed This Visit   None Visit Diagnoses     Exposure to the flu    -  Primary   Relevant Medications   oseltamivir (TAMIFLU) 75 MG capsule       Will give treatment in case symptoms start Follow up plan: Return if symptoms worsen or fail to improve.     I discussed the assessment and treatment plan with the patient. The patient was provided an opportunity to ask questions and all were answered. The patient agreed  with the plan and demonstrated an understanding of the instructions.   The patient was advised to call back or seek an in-person evaluation if the symptoms worsen or if the condition fails to improve as anticipated.  The above assessment and management plan was discussed with the patient. The patient verbalized understanding of and has agreed to the management plan. Patient is aware to call the clinic if symptoms persist or worsen. Patient is aware when to return to the clinic for a follow-up visit. Patient educated on when it is appropriate to go to the emergency  department.    I provided 6 minutes of non-face-to-face time during this encounter.    Nils Pyle, MD

## 2021-03-03 ENCOUNTER — Ambulatory Visit: Payer: No Typology Code available for payment source | Admitting: Family Medicine

## 2021-03-06 ENCOUNTER — Other Ambulatory Visit (HOSPITAL_COMMUNITY): Payer: Self-pay

## 2021-03-13 ENCOUNTER — Other Ambulatory Visit (HOSPITAL_COMMUNITY): Payer: Self-pay

## 2021-03-13 MED FILL — Metformin HCl Tab 500 MG: ORAL | 90 days supply | Qty: 180 | Fill #0 | Status: AC

## 2021-03-13 MED FILL — Sitagliptin Phosphate Tab 100 MG (Base Equiv): ORAL | 30 days supply | Qty: 30 | Fill #6 | Status: AC

## 2021-03-13 MED FILL — Atorvastatin Calcium Tab 20 MG (Base Equivalent): ORAL | 90 days supply | Qty: 90 | Fill #2 | Status: AC

## 2021-03-21 ENCOUNTER — Ambulatory Visit (INDEPENDENT_AMBULATORY_CARE_PROVIDER_SITE_OTHER): Payer: No Typology Code available for payment source | Admitting: Family Medicine

## 2021-03-21 ENCOUNTER — Encounter: Payer: Self-pay | Admitting: *Deleted

## 2021-03-21 ENCOUNTER — Encounter: Payer: Self-pay | Admitting: Family Medicine

## 2021-03-21 VITALS — BP 112/72 | HR 89 | Temp 97.9°F | Ht 71.0 in | Wt 234.2 lb

## 2021-03-21 DIAGNOSIS — L0293 Carbuncle, unspecified: Secondary | ICD-10-CM | POA: Diagnosis not present

## 2021-03-21 MED ORDER — AMOXICILLIN 500 MG PO CAPS: 500.0000 mg | ORAL_CAPSULE | Freq: Three times a day (TID) | ORAL | 0 refills | Status: AC

## 2021-03-21 MED ORDER — AMOXICILLIN-POT CLAVULANATE 875-125 MG PO TABS: 1.0000 | ORAL_TABLET | Freq: Two times a day (BID) | ORAL | 0 refills | Status: DC

## 2021-03-21 NOTE — Progress Notes (Signed)
No chief complaint on file.   HPI  Patient presents today for lesion at the right thigh. First noted on 12/24. Looked black. Started putting neosporin and a bandaid on it. Seeme to get bigger, more painful. IT rubbed against his leg and opened up releasing bloody fluid a day or two ago. No fever.  PMH: Smoking status noted ROS: Per HPI  Objective: BP 112/72    Pulse 89    Temp 97.9 F (36.6 C)    Ht 5\' 11"  (1.803 m)    Wt 234 lb 3.2 oz (106.2 kg)    SpO2 94%    BMI 32.66 kg/m  Gen: NAD, alert, cooperative with exam HEENT: NCAT, Ext: No edema, warm Neuro: Alert and oriented, No gross deficits Skin: there is a 1 cm open lesion at the proximal medial thigh. Good granulation with minimal slough.  Assessment and plan:  1. Carbuncle     Meds ordered this encounter  Medications   amoxicillin-clavulanate (AUGMENTIN) 875-125 MG tablet    Sig: Take 1 tablet by mouth 2 (two) times daily. Take all of this medication    Dispense:  20 tablet    Refill:  0    No orders of the defined types were placed in this encounter.   Follow up as needed.  , MD

## 2021-03-21 NOTE — Addendum Note (Signed)
Addended by: Adella Hare B on: 03/21/2021 04:02 PM   Modules accepted: Orders

## 2021-04-12 ENCOUNTER — Encounter: Payer: Self-pay | Admitting: Family Medicine

## 2021-04-12 ENCOUNTER — Other Ambulatory Visit (HOSPITAL_COMMUNITY): Payer: Self-pay

## 2021-04-12 ENCOUNTER — Ambulatory Visit (INDEPENDENT_AMBULATORY_CARE_PROVIDER_SITE_OTHER): Payer: No Typology Code available for payment source | Admitting: Family Medicine

## 2021-04-12 VITALS — BP 110/72 | HR 88 | Temp 98.1°F | Ht 71.0 in | Wt 234.0 lb

## 2021-04-12 DIAGNOSIS — N1832 Chronic kidney disease, stage 3b: Secondary | ICD-10-CM

## 2021-04-12 DIAGNOSIS — E785 Hyperlipidemia, unspecified: Secondary | ICD-10-CM

## 2021-04-12 DIAGNOSIS — E1169 Type 2 diabetes mellitus with other specified complication: Secondary | ICD-10-CM

## 2021-04-12 DIAGNOSIS — Z23 Encounter for immunization: Secondary | ICD-10-CM

## 2021-04-12 DIAGNOSIS — E1122 Type 2 diabetes mellitus with diabetic chronic kidney disease: Secondary | ICD-10-CM | POA: Diagnosis not present

## 2021-04-12 DIAGNOSIS — N1831 Chronic kidney disease, stage 3a: Secondary | ICD-10-CM

## 2021-04-12 LAB — BAYER DCA HB A1C WAIVED: HB A1C (BAYER DCA - WAIVED): 8.1 % — ABNORMAL HIGH (ref 4.8–5.6)

## 2021-04-12 MED ORDER — ATORVASTATIN CALCIUM 20 MG PO TABS
ORAL_TABLET | Freq: Every day | ORAL | 3 refills | Status: DC
  Filled 2021-04-12: qty 90, fill #0
  Filled 2021-06-15: qty 90, 90d supply, fill #0
  Filled 2021-09-13: qty 90, 90d supply, fill #1
  Filled 2021-12-20: qty 90, 90d supply, fill #2

## 2021-04-12 MED ORDER — METFORMIN HCL 500 MG PO TABS
ORAL_TABLET | Freq: Two times a day (BID) | ORAL | 3 refills | Status: DC
  Filled 2021-04-12: qty 180, fill #0
  Filled 2021-09-13: qty 180, 90d supply, fill #0

## 2021-04-12 MED ORDER — EMPAGLIFLOZIN 25 MG PO TABS
25.0000 mg | ORAL_TABLET | Freq: Every day | ORAL | 3 refills | Status: DC
  Filled 2021-04-12: qty 90, 90d supply, fill #0
  Filled 2021-07-10: qty 90, 90d supply, fill #1
  Filled 2021-10-16: qty 90, 90d supply, fill #2

## 2021-04-12 MED ORDER — SITAGLIPTIN PHOSPHATE 100 MG PO TABS
ORAL_TABLET | Freq: Every day | ORAL | 3 refills | Status: DC
  Filled 2021-04-12: qty 90, fill #0
  Filled 2021-05-14: qty 30, 30d supply, fill #0
  Filled 2021-06-15: qty 30, 30d supply, fill #1
  Filled 2021-07-20: qty 30, 30d supply, fill #2
  Filled 2021-08-23: qty 30, 30d supply, fill #3
  Filled 2021-09-27: qty 30, 30d supply, fill #4
  Filled 2021-10-30: qty 30, 30d supply, fill #5
  Filled 2021-11-21: qty 30, 30d supply, fill #6
  Filled 2021-12-20: qty 30, 30d supply, fill #7
  Filled 2022-01-31 – 2022-02-19 (×2): qty 30, 30d supply, fill #8
  Filled 2022-03-26 – 2022-03-28 (×2): qty 30, 30d supply, fill #9

## 2021-04-12 MED FILL — Sitagliptin Phosphate Tab 100 MG (Base Equiv): ORAL | 30 days supply | Qty: 30 | Fill #7 | Status: AC

## 2021-04-12 NOTE — Progress Notes (Signed)
BP 110/72    Pulse 88    Temp 98.1 F (36.7 C) (Temporal)    Ht '5\' 11"'  (1.803 m)    Wt 234 lb (106.1 kg)    SpO2 95%    BMI 32.64 kg/m    Subjective:   Patient ID: Matthew Bolton, male    DOB: 09/03/1955, 66 y.o.   MRN: [id number]  HPI: Matthew Bolton is a 66 y.o. male presenting on 04/12/2021 for Medical Management of Chronic Issues   HPI Type 2 diabetes mellitus Patient comes in today for recheck of his diabetes. Patient has been currently taking Januvia and metformin, A1c is elevated today and he does admit that he was eating different over the holidays that is what is affected him.. Patient is not currently on an ACE inhibitor/ARB. Patient has not seen an ophthalmologist this year. Patient denies any issues with their feet. The symptom started onset as an adult hyperlipidemia ARE RELATED TO DM   Hyperlipidemia Patient is coming in for recheck of his hyperlipidemia. The patient is currently taking Lipitor. They deny any issues with myalgias or history of liver damage from it. They deny any focal numbness or weakness or chest pain.   Stage III CKD recheck Patient is coming in for CKD recheck, will check blood work.  Relevant past medical, surgical, family and social history reviewed and updated as indicated. Interim medical history since our last visit reviewed. Allergies and medications reviewed and updated.  Review of Systems  Constitutional:  Negative for chills and fever.  Eyes:  Negative for visual disturbance.  Respiratory:  Negative for shortness of breath and wheezing.   Cardiovascular:  Negative for chest pain and leg swelling.  Musculoskeletal:  Negative for back pain and gait problem.  Skin:  Negative for rash.  Neurological:  Negative for dizziness and weakness.  All other systems reviewed and are negative.  Per HPI unless specifically indicated above   Allergies as of 04/12/2021   No Known Allergies      Medication List        Accurate as of April 12, 2021  4:24 PM. If you have any questions, ask your nurse or doctor.          STOP taking these medications    oseltamivir 75 MG capsule Commonly known as: Tamiflu Stopped by: Fransisca Kaufmann Charika Mikelson, MD       TAKE these medications    acetaminophen 325 MG tablet Commonly known as: TYLENOL Take 650 mg by mouth every 6 (six) hours as needed.   atorvastatin 20 MG tablet Commonly known as: LIPITOR TAKE 1 TABLET BY MOUTH ONCE A DAY   cholecalciferol 25 MCG (1000 UNIT) tablet Commonly known as: VITAMIN D3 Take 1,000 Units by mouth daily.   colchicine 0.6 MG tablet Day 1: Take 1.2 mg at the first sign of flare, followed in 1 hour with a single dose of 0.6 mg. Then 1 tablet twice daily until gout resolved.   empagliflozin 25 MG Tabs tablet Commonly known as: Jardiance Take 1 tablet (25 mg total) by mouth daily before breakfast. Started by: Fransisca Kaufmann Auston Halfmann, MD   metFORMIN 500 MG tablet Commonly known as: GLUCOPHAGE TAKE 1 TABLET BY MOUTH 2 TIMES DAILY WITH A MEAL   nystatin cream Commonly known as: MYCOSTATIN Apply 1 application topically at bedtime.   Nyamyc powder Generic drug: nystatin Apply 1 application topically 3 (three) times daily.   sildenafil 20 MG tablet Commonly known as: REVATIO Take 1 to  4 tablets (20-80 mg total) by mouth daily as needed.   sitaGLIPtin 100 MG tablet Commonly known as: JANUVIA TAKE 1 TABLET BY MOUTH ONCE A DAY   zinc gluconate 50 MG tablet Take 50 mg by mouth daily.         Objective:   BP 110/72    Pulse 88    Temp 98.1 F (36.7 C) (Temporal)    Ht '5\' 11"'  (1.803 m)    Wt 234 lb (106.1 kg)    SpO2 95%    BMI 32.64 kg/m   Wt Readings from Last 3 Encounters:  04/12/21 234 lb (106.1 kg)  03/21/21 234 lb 3.2 oz (106.2 kg)  11/30/20 232 lb (105.2 kg)    Physical Exam Vitals and nursing note reviewed.  Constitutional:      General: He is not in acute distress.    Appearance: He is well-developed. He is not diaphoretic.   Eyes:     General: No scleral icterus.    Conjunctiva/sclera: Conjunctivae normal.  Neck:     Thyroid: No thyromegaly.  Cardiovascular:     Rate and Rhythm: Normal rate and regular rhythm.     Heart sounds: Normal heart sounds. No murmur heard. Pulmonary:     Effort: Pulmonary effort is normal. No respiratory distress.     Breath sounds: Normal breath sounds. No wheezing.  Musculoskeletal:        General: Normal range of motion.  Skin:    General: Skin is warm and dry.     Findings: No rash.  Neurological:     Mental Status: He is alert and oriented to person, place, and time.     Coordination: Coordination normal.  Psychiatric:        Behavior: Behavior normal.    Assessment & Plan:   Problem List Items Addressed This Visit       Endocrine   Type 2 diabetes mellitus with stage 3a chronic kidney disease, without long-term current use of insulin ([facility]) - Primary   Relevant Medications   sitaGLIPtin (JANUVIA) 100 MG tablet   metFORMIN (GLUCOPHAGE) 500 MG tablet   atorvastatin (LIPITOR) 20 MG tablet   empagliflozin (JARDIANCE) 25 MG TABS tablet   Other Relevant Orders   CMP14+EGFR   Bayer DCA Hb A1c Waived   Hyperlipidemia associated with type 2 diabetes mellitus (HCC)   Relevant Medications   sitaGLIPtin (JANUVIA) 100 MG tablet   metFORMIN (GLUCOPHAGE) 500 MG tablet   atorvastatin (LIPITOR) 20 MG tablet   empagliflozin (JARDIANCE) 25 MG TABS tablet     Genitourinary   Stage 3b chronic kidney disease (HCC)    A1c is up, will add Jardiance, sent the medicine for him.  Recheck kidney function as well. Follow up plan: Return in about 3 months (around 07/11/2021), or if symptoms worsen or fail to improve, for Diabetes .  Counseling provided for all of the vaccine components Orders Placed This Encounter  Procedures   CMP14+EGFR   Bayer [place] Hb A1c [place] Derrion Tritz, MD [facility] 04/12/2021, 4:24 PM

## 2021-04-13 ENCOUNTER — Other Ambulatory Visit (HOSPITAL_COMMUNITY): Payer: Self-pay

## 2021-04-13 LAB — CMP14+EGFR
ALT: 40 IU/L (ref 0–44)
AST: 29 IU/L (ref 0–40)
Albumin/Globulin Ratio: 1.5 (ref 1.2–2.2)
Albumin: 4.1 g/dL (ref 3.8–4.8)
Alkaline Phosphatase: 100 IU/L (ref 44–121)
BUN/Creatinine Ratio: 15 (ref 10–24)
BUN: 24 mg/dL (ref 8–27)
Bilirubin Total: 0.5 mg/dL (ref 0.0–1.2)
CO2: 22 mmol/L (ref 20–29)
Calcium: 9.4 mg/dL (ref 8.6–10.2)
Chloride: 103 mmol/L (ref 96–106)
Creatinine, Ser: 1.64 mg/dL — ABNORMAL HIGH (ref 0.76–1.27)
Globulin, Total: 2.8 g/dL (ref 1.5–4.5)
Glucose: 104 mg/dL — ABNORMAL HIGH (ref 70–99)
Potassium: 4.4 mmol/L (ref 3.5–5.2)
Sodium: 140 mmol/L (ref 134–144)
Total Protein: 6.9 g/dL (ref 6.0–8.5)
eGFR: 46 mL/min/{1.73_m2} — ABNORMAL LOW (ref >59)

## 2021-05-14 ENCOUNTER — Other Ambulatory Visit: Payer: Self-pay | Admitting: Family Medicine

## 2021-05-15 ENCOUNTER — Other Ambulatory Visit (HOSPITAL_COMMUNITY): Payer: Self-pay

## 2021-05-15 MED ORDER — SILDENAFIL CITRATE 20 MG PO TABS
20.0000 mg | ORAL_TABLET | Freq: Every day | ORAL | 1 refills | Status: DC | PRN
  Filled 2021-05-15: qty 30, 7d supply, fill #0

## 2021-05-15 NOTE — Telephone Encounter (Signed)
Last office visit 04/12/21 Last refill 08/31/20, #30, no refills

## 2021-05-16 ENCOUNTER — Other Ambulatory Visit (HOSPITAL_COMMUNITY): Payer: Self-pay

## 2021-06-15 ENCOUNTER — Other Ambulatory Visit (HOSPITAL_COMMUNITY): Payer: Self-pay

## 2021-07-10 ENCOUNTER — Other Ambulatory Visit (HOSPITAL_COMMUNITY): Payer: Self-pay

## 2021-07-14 ENCOUNTER — Ambulatory Visit (INDEPENDENT_AMBULATORY_CARE_PROVIDER_SITE_OTHER): Payer: No Typology Code available for payment source | Admitting: Family Medicine

## 2021-07-14 ENCOUNTER — Encounter: Payer: Self-pay | Admitting: Family Medicine

## 2021-07-14 VITALS — BP 111/67 | HR 86 | Ht 71.0 in | Wt 224.0 lb

## 2021-07-14 DIAGNOSIS — E785 Hyperlipidemia, unspecified: Secondary | ICD-10-CM

## 2021-07-14 DIAGNOSIS — N1832 Chronic kidney disease, stage 3b: Secondary | ICD-10-CM

## 2021-07-14 DIAGNOSIS — E1122 Type 2 diabetes mellitus with diabetic chronic kidney disease: Secondary | ICD-10-CM

## 2021-07-14 DIAGNOSIS — N1831 Chronic kidney disease, stage 3a: Secondary | ICD-10-CM

## 2021-07-14 DIAGNOSIS — E1169 Type 2 diabetes mellitus with other specified complication: Secondary | ICD-10-CM

## 2021-07-14 LAB — BAYER DCA HB A1C WAIVED: HB A1C (BAYER DCA - WAIVED): 6.5 % — ABNORMAL HIGH (ref 4.8–5.6)

## 2021-07-14 NOTE — Progress Notes (Signed)
? ?BP 111/67   Pulse 86   Ht '5\' 11"'  (1.803 m)   Wt 224 lb (101.6 kg)   SpO2 95%   BMI 31.24 kg/m?   ? ?Subjective:  ? ?Patient ID: Matthew Bolton, male    DOB: 04/26/1955, 66 y.o.   MRN: [id number] ? ?HPI: ?Matthew Bolton is a 66 y.o. male presenting on 07/14/2021 for Medical Management of Chronic Issues and Diabetes ? ? ?HPI ?Type 2 diabetes mellitus uncontrolled ?Patient comes in today for recheck of his diabetes. Patient has been currently taking Jardiance and metformin and Januvia. Patient is not currently on an ACE inhibitor/ARB. Patient has not seen an ophthalmologist this year. Patient denies any issues with their feet. The symptom started onset as an adult hyperlipidemia ARE RELATED TO DM  ? ?Hyperlipidemia ?Patient is coming in for recheck of his hyperlipidemia. The patient is currently taking atorvastatin. They deny any issues with myalgias or history of liver damage from it. They deny any focal numbness or weakness or chest pain.  ? ?Patient has a small spot in his groin gets hard, bright and crease of his groin, he denies any fevers or chills or redness or warmth or pain with it but it is just firm and started noticing it over the past week. ? ?Relevant past medical, surgical, family and social history reviewed and updated as indicated. Interim medical history since our last visit reviewed. ?Allergies and medications reviewed and updated. ? ?Review of Systems  ?Constitutional:  Negative for chills and fever.  ?Eyes:  Negative for visual disturbance.  ?Respiratory:  Negative for shortness of breath and wheezing.   ?Cardiovascular:  Negative for chest pain and leg swelling.  ?Musculoskeletal:  Negative for back pain and gait problem.  ?Skin:  Negative for color change and rash.  ?Neurological:  Negative for dizziness, weakness and light-headedness.  ?All other systems reviewed and are negative. ? ?Per HPI unless specifically indicated above ? ? ?Allergies as of 07/14/2021   ?No Known Allergies ?   ? ?  ?Medication List  ?  ? ?  ? Accurate as of July 14, 2021  8:59 AM. If you have any questions, ask your nurse or doctor.  ?  ?  ? ?  ? ?acetaminophen 325 MG tablet ?Commonly known as: TYLENOL ?Take 650 mg by mouth every 6 (six) hours as needed. ?  ?atorvastatin 20 MG tablet ?Commonly known as: LIPITOR ?TAKE 1 TABLET BY MOUTH ONCE A DAY ?  ?cholecalciferol 25 MCG (1000 UNIT) tablet ?Commonly known as: VITAMIN D3 ?Take 1,000 Units by mouth daily. ?  ?colchicine 0.6 MG tablet ?Day 1: Take 1.2 mg at the first sign of flare, followed in 1 hour with a single dose of 0.6 mg. Then 1 tablet twice daily until gout resolved. ?  ?Januvia 100 MG tablet ?Generic drug: sitaGLIPtin ?TAKE 1 TABLET BY MOUTH ONCE A DAY ?  ?Jardiance 25 MG Tabs tablet ?Generic drug: empagliflozin ?Take 1 tablet (25 mg total) by mouth daily before breakfast. ?  ?metFORMIN 500 MG tablet ?Commonly known as: GLUCOPHAGE ?TAKE 1 TABLET BY MOUTH 2 TIMES DAILY WITH A MEAL ?  ?nystatin cream ?Commonly known as: MYCOSTATIN ?Apply 1 application topically at bedtime. ?  ?Nyamyc powder ?Generic drug: nystatin ?Apply 1 application topically 3 (three) times daily. ?  ?sildenafil 20 MG tablet ?Commonly known as: REVATIO ?Take 1 to 4 tablets by mouth daily as needed. ?  ?zinc gluconate 50 MG tablet ?Take 50 mg by mouth daily. ?  ? ?  ? ? ? ?  Objective:  ? ?BP 111/67   Pulse 86   Ht '5\' 11"'  (1.803 m)   Wt 224 lb (101.6 kg)   SpO2 95%   BMI 31.24 kg/m?   ?Wt Readings from Last 3 Encounters:  ?07/14/21 224 lb (101.6 kg)  ?04/12/21 234 lb (106.1 kg)  ?03/21/21 234 lb 3.2 oz (106.2 kg)  ?  ?Physical Exam ?Vitals and nursing note reviewed.  ?Constitutional:   ?   General: He is not in acute distress. ?   Appearance: He is well-developed. He is not diaphoretic.  ?Eyes:  ?   General: No scleral icterus. ?   Conjunctiva/sclera: Conjunctivae normal.  ?Neck:  ?   Thyroid: No thyromegaly.  ?Cardiovascular:  ?   Rate and Rhythm: Normal rate and regular rhythm.  ?   Heart  sounds: Normal heart sounds. No murmur heard. ?Pulmonary:  ?   Effort: Pulmonary effort is normal. No respiratory distress.  ?   Breath sounds: Normal breath sounds. No wheezing.  ?Musculoskeletal:     ?   General: Normal range of motion.  ?   Cervical back: Neck supple.  ?Lymphadenopathy:  ?   Cervical: No cervical adenopathy.  ?Skin: ?   General: Skin is warm and dry.  ?   Findings: No rash.  ? ?    ?Neurological:  ?   Mental Status: He is alert and oriented to person, place, and time.  ?   Coordination: Coordination normal.  ?Psychiatric:     ?   Behavior: Behavior normal.  ? ? ? ? ?Assessment & Plan:  ? ?Problem List Items Addressed This Visit   ? ?  ? Endocrine  ? Type 2 diabetes mellitus with stage 3a chronic kidney disease, without long-term current use of insulin ([place]) - Primary  ? Relevant Orders  ? CBC with Differential/Platelet  ? CMP14+EGFR  ? Lipid panel  ? Bayer DCA Hb A1c Waived  ? Hyperlipidemia associated with type 2 diabetes mellitus ([place])  ? Relevant Orders  ? CBC with Differential/Platelet  ? CMP14+EGFR  ? Lipid panel  ? Bayer DCA Hb A1c Waived  ?  ? Genitourinary  ? Stage 3b chronic kidney disease ([place])  ? Relevant Orders  ? CBC with Differential/Platelet  ? CMP14+EGFR  ? Lipid panel  ? Bayer DCA Hb A1c Waived  ?  ?Recommend antifungal and antibiotic cream twice a day and to let me know if it started enlarging or getting worse. ? ?A1c looks great at 6.5, continue current medicine and treatment.  Blood pressure looks good as well ?Follow up plan: ?Return in about 3 months (around 10/13/2021), or if symptoms worsen or fail to improve, for Diabetes and hyperlipidemia reach. ? ?Counseling provided for all of the vaccine components ?Orders Placed This Encounter  ?Procedures  ? CBC with Differential/Platelet  ? CMP14+EGFR  ? Lipid panel  ? Bayer DCA Hb A1c Waived  ? ? ?Caryl Pina, MD ?[place] ?07/14/2021, 8:59 AM ? ? ? ? ?

## 2021-07-15 LAB — CMP14+EGFR
ALT: 27 IU/L (ref 0–44)
AST: 22 IU/L (ref 0–40)
Albumin/Globulin Ratio: 1.5 (ref 1.2–2.2)
Albumin: 4.1 g/dL (ref 3.8–4.8)
Alkaline Phosphatase: 86 IU/L (ref 44–121)
BUN/Creatinine Ratio: 14 (ref 10–24)
BUN: 20 mg/dL (ref 8–27)
Bilirubin Total: 0.5 mg/dL (ref 0.0–1.2)
CO2: 19 mmol/L — ABNORMAL LOW (ref 20–29)
Calcium: 9.3 mg/dL (ref 8.6–10.2)
Chloride: 103 mmol/L (ref 96–106)
Creatinine, Ser: 1.46 mg/dL — ABNORMAL HIGH (ref 0.76–1.27)
Globulin, Total: 2.8 g/dL (ref 1.5–4.5)
Glucose: 223 mg/dL — ABNORMAL HIGH (ref 70–99)
Potassium: 5 mmol/L (ref 3.5–5.2)
Sodium: 141 mmol/L (ref 134–144)
Total Protein: 6.9 g/dL (ref 6.0–8.5)
eGFR: 53 mL/min/{1.73_m2} — ABNORMAL LOW (ref >59)

## 2021-07-15 LAB — CBC WITH DIFFERENTIAL/PLATELET
Basophils Absolute: 0.1 10*3/uL (ref 0.0–0.2)
Basos: 1 %
EOS (ABSOLUTE): 0.1 10*3/uL (ref 0.0–0.4)
Eos: 2 %
Hematocrit: 51.1 % — ABNORMAL HIGH (ref 37.5–51.0)
Hemoglobin: 16.8 g/dL (ref 13.0–17.7)
Immature Grans (Abs): 0 10*3/uL (ref 0.0–0.1)
Immature Granulocytes: 0 %
Lymphocytes Absolute: 1.2 10*3/uL (ref 0.7–3.1)
Lymphs: 20 %
MCH: 28.8 pg (ref 26.6–33.0)
MCHC: 32.9 g/dL (ref 31.5–35.7)
MCV: 88 fL (ref 79–97)
Monocytes Absolute: 0.3 10*3/uL (ref 0.1–0.9)
Monocytes: 6 %
Neutrophils Absolute: 4.3 10*3/uL (ref 1.4–7.0)
Neutrophils: 71 %
Platelets: 137 10*3/uL — ABNORMAL LOW (ref 150–450)
RBC: 5.84 x10E6/uL — ABNORMAL HIGH (ref 4.14–5.80)
RDW: 13.7 % (ref 11.6–15.4)
WBC: 6 10*3/uL (ref 3.4–10.8)

## 2021-07-15 LAB — LIPID PANEL
Chol/HDL Ratio: 3.5 ratio (ref 0.0–5.0)
Cholesterol, Total: 131 mg/dL (ref 100–199)
HDL: 37 mg/dL — ABNORMAL LOW (ref >39)
LDL Chol Calc (NIH): 64 mg/dL (ref 0–99)
Triglycerides: 178 mg/dL — ABNORMAL HIGH (ref 0–149)
VLDL Cholesterol Cal: 30 mg/dL (ref 5–40)

## 2021-07-21 ENCOUNTER — Other Ambulatory Visit (HOSPITAL_COMMUNITY): Payer: Self-pay

## 2021-08-09 ENCOUNTER — Ambulatory Visit (INDEPENDENT_AMBULATORY_CARE_PROVIDER_SITE_OTHER): Payer: No Typology Code available for payment source | Admitting: Family Medicine

## 2021-08-09 ENCOUNTER — Other Ambulatory Visit (HOSPITAL_COMMUNITY): Payer: Self-pay

## 2021-08-09 ENCOUNTER — Encounter: Payer: Self-pay | Admitting: Family Medicine

## 2021-08-09 VITALS — BP 103/67 | HR 77 | Temp 97.9°F | Ht 71.0 in | Wt 223.2 lb

## 2021-08-09 DIAGNOSIS — L0231 Cutaneous abscess of buttock: Secondary | ICD-10-CM | POA: Diagnosis not present

## 2021-08-09 DIAGNOSIS — L03317 Cellulitis of buttock: Secondary | ICD-10-CM | POA: Diagnosis not present

## 2021-08-09 MED ORDER — DOXYCYCLINE HYCLATE 100 MG PO TABS
100.0000 mg | ORAL_TABLET | Freq: Two times a day (BID) | ORAL | 0 refills | Status: AC
  Filled 2021-08-09: qty 14, 7d supply, fill #0

## 2021-08-09 NOTE — Progress Notes (Signed)
? ?  Acute Office Visit ? ?Subjective:  ? ?  ?Patient ID: Matthew Bolton, male    DOB: Feb 10, 1956, 66 y.o.   MRN: [id number] ? ?Chief Complaint  ?Patient presents with  ? Cellulitis  ? ? ?HPI ?Patient is in today for a boil in the crease of his left glute/thigh. This area has been tender for the last 4 days. There has been some purulent drainage. He has been using a bandage and neosporin to the area. He also had some left over augmentin and he did take 3 does of this. The tenderness has improved. Denies fever.  ? ?ROS ?As per HPI.  ? ?   ?Objective:  ?  ?BP 103/67   Pulse 77   Temp 97.9 ?F (36.6 ?C) (Temporal)   Ht 5\' 11"  (1.803 m)   Wt 223 lb 4 oz (101.3 kg)   SpO2 96%   BMI 31.14 kg/m?  ? ? ?Physical Exam ?Vitals and nursing note reviewed.  ?Constitutional:   ?   General: He is not in acute distress. ?   Appearance: He is not ill-appearing, toxic-appearing or diaphoretic.  ?Pulmonary:  ?   Effort: Pulmonary effort is normal. No respiratory distress.  ?Musculoskeletal:  ?   Right lower leg: No edema.  ?   Left lower leg: No edema.  ?Skin: ?   General: Skin is warm and dry.  ?   Findings: Abscess (Left gluteal/thigh crease. Open with purulent drainage present with area of induration. Surrounding erythema, warmth, and tenderness. No fluctuance present.) present.  ?Neurological:  ?   Mental Status: He is alert and oriented to person, place, and time.  ?Psychiatric:     ?   Mood and Affect: Mood normal.     ?   Behavior: Behavior normal.  ? ? ?No results found for any visits on 08/09/21. ? ? ?   ?Assessment & Plan:  ? ?Dejohn was seen today for cellulitis. ? ?Diagnoses and all orders for this visit: ? ?Abscess and cellulitis of gluteal region ?Doxycyline as below. Warm compresses. Handout given. Discussed antibiotic stewardship. Return to office for new or worsening symptoms, or if symptoms persist.  ?-     doxycycline (VIBRA-TABS) 100 MG tablet; Take 1 tablet by mouth 2 times daily for 7 days. ? ?The patient  indicates understanding of these issues and agrees with the plan. ? ? ?Ethelene Browns, FNP ? ? ?

## 2021-08-09 NOTE — Patient Instructions (Signed)
Skin Abscess  A skin abscess is an infected area on or under your skin that contains a collection of pus and other material. An abscess may also be called a furuncle, carbuncle, or boil. An abscess can occur in or on almost any part of your body. Some abscesses break open (rupture) on their own. Most continue to get worse unless they are treated. The infection can spread deeper into the body and eventually into your blood, which can make you feel ill. Treatment usually involves draining the abscess. What are the causes? An abscess occurs when germs, like bacteria, pass through your skin and cause an infection. This may be caused by: A scrape or cut on your skin. A puncture wound through your skin, including a needle injection or insect bite. Blocked oil or sweat glands. Blocked and infected hair follicles. A cyst that forms beneath your skin (sebaceous cyst) and becomes infected. What increases the risk? This condition is more likely to develop in people who: Have a weak body defense system (immune system). Have diabetes. Have dry and irritated skin. Get frequent injections or use illegal IV drugs. Have a foreign body in a wound, such as a splinter. Have problems with their lymph system or veins. What are the signs or symptoms? Symptoms of this condition include: A painful, firm bump under the skin. A bump with pus at the top. This may break through the skin and drain. Other symptoms include: Redness surrounding the abscess site. Warmth. Swelling of the lymph nodes (glands) near the abscess. Tenderness. A sore on the skin. How is this diagnosed? This condition may be diagnosed based on: A physical exam. Your medical history. A sample of pus. This may be used to find out what is causing the infection. Blood tests. Imaging tests, such as an ultrasound, CT scan, or MRI. How is this treated? A small abscess that drains on its own may not need treatment. Treatment for larger abscesses  may include: Moist heat or heat pack applied to the area several times a day. A procedure to drain the abscess (incision and drainage). Antibiotic medicines. For a severe abscess, you may first get antibiotics through an IV and then change to antibiotics by mouth. Follow these instructions at home: Medicines  Take over-the-counter and prescription medicines only as told by your health care provider. If you were prescribed an antibiotic medicine, take it as told by your health care provider. Do not stop taking the antibiotic even if you start to feel better. Abscess care  If you have an abscess that has not drained, apply heat to the affected area. Use the heat source that your health care provider recommends, such as a moist heat pack or a heating pad. Place a towel between your skin and the heat source. Leave the heat on for 20-30 minutes. Remove the heat if your skin turns bright red. This is especially important if you are unable to feel pain, heat, or cold. You may have a greater risk of getting burned. Follow instructions from your health care provider about how to take care of your abscess. Make sure you: Cover the abscess with a bandage (dressing). Change your dressing or gauze as told by your health care provider. Wash your hands with soap and water before you change the dressing or gauze. If soap and water are not available, use hand sanitizer. Check your abscess every day for signs of a worsening infection. Check for: More redness, swelling, or pain. More fluid or blood. Warmth.   More pus or a bad smell. General instructions To avoid spreading the infection: Do not share personal care items, towels, or hot tubs with others. Avoid making skin contact with other people. Keep all follow-up visits as told by your health care provider. This is important. Contact a health care provider if you have: More redness, swelling, or pain around your abscess. More fluid or blood coming from  your abscess. Warm skin around your abscess. More pus or a bad smell coming from your abscess. Muscle aches. Chills or a general ill feeling. Get help right away if you: Have severe pain. See red streaks on your skin spreading away from the abscess. See redness that spreads quickly. Have a fever or chills. Summary A skin abscess is an infected area on or under your skin that contains a collection of pus and other material. A small abscess that drains on its own may not need treatment. Treatment for larger abscesses may include having a procedure to drain the abscess and taking an antibiotic. This information is not intended to replace advice given to you by your health care provider. Make sure you discuss any questions you have with your health care provider. Document Revised: 12/19/2020 Document Reviewed: 12/19/2020 Elsevier Patient Education  2023 [employer] Inc.  

## 2021-08-16 LAB — HM DIABETES EYE EXAM

## 2021-08-23 ENCOUNTER — Other Ambulatory Visit (HOSPITAL_COMMUNITY): Payer: Self-pay

## 2021-09-12 ENCOUNTER — Telehealth: Payer: Self-pay | Admitting: Family Medicine

## 2021-09-12 DIAGNOSIS — N529 Male erectile dysfunction, unspecified: Secondary | ICD-10-CM

## 2021-09-12 NOTE — Telephone Encounter (Signed)
Pt is requesting brand name. He takes generic only as needed and it is not helping him.  Would like Viagra sent to [facility] pharmacy.  Pt aware [facility] will review 6/21 when he returns to the office.

## 2021-09-13 ENCOUNTER — Other Ambulatory Visit (HOSPITAL_COMMUNITY): Payer: Self-pay

## 2021-09-13 MED ORDER — VIAGRA 100 MG PO TABS
50.0000 mg | ORAL_TABLET | Freq: Every day | ORAL | 3 refills | Status: DC | PRN
  Filled 2021-09-13: qty 6, 30d supply, fill #0

## 2021-09-13 NOTE — Telephone Encounter (Signed)
I spoke with the pharmacist at [facility] and the brand name Viagra is still available but more than likely his insurance will deny coverage because of cost.  She said if you would go ahead and prescribe they will run it through and when it kicks out a prior authorization they will send that to [place] to get started on.

## 2021-09-13 NOTE — Telephone Encounter (Signed)
I sent a prescription for Viagra to the pharmacy, it is typically not covered by his insurance and may be more expensive but he can see what it comes out as.

## 2021-09-13 NOTE — Telephone Encounter (Signed)
Sildenafil is the generic for Viagra, as far as I understand I do not think to make the brand-name Viagra anymore but you can ask the pharmacy.

## 2021-09-13 NOTE — Telephone Encounter (Signed)
Left message informing pt and advised to call back if needed.

## 2021-09-14 ENCOUNTER — Other Ambulatory Visit (HOSPITAL_COMMUNITY): Payer: Self-pay

## 2021-09-14 ENCOUNTER — Other Ambulatory Visit: Payer: Self-pay | Admitting: Family Medicine

## 2021-09-15 ENCOUNTER — Other Ambulatory Visit (HOSPITAL_COMMUNITY): Payer: Self-pay

## 2021-09-19 ENCOUNTER — Telehealth: Payer: Self-pay

## 2021-09-19 NOTE — Telephone Encounter (Signed)
Received pa request through covermymeds for Viagra.  Key [id number]  Pt will need to pay out of pocket for the medication

## 2021-09-27 ENCOUNTER — Other Ambulatory Visit (HOSPITAL_COMMUNITY): Payer: Self-pay

## 2021-10-16 ENCOUNTER — Other Ambulatory Visit (HOSPITAL_COMMUNITY): Payer: Self-pay

## 2021-10-27 ENCOUNTER — Encounter: Payer: Self-pay | Admitting: Family Medicine

## 2021-10-27 ENCOUNTER — Ambulatory Visit (INDEPENDENT_AMBULATORY_CARE_PROVIDER_SITE_OTHER): Payer: No Typology Code available for payment source | Admitting: Family Medicine

## 2021-10-27 ENCOUNTER — Other Ambulatory Visit (HOSPITAL_COMMUNITY): Payer: Self-pay

## 2021-10-27 VITALS — BP 112/69 | HR 83 | Temp 98.0°F | Ht 71.0 in | Wt 219.0 lb

## 2021-10-27 DIAGNOSIS — E1122 Type 2 diabetes mellitus with diabetic chronic kidney disease: Secondary | ICD-10-CM | POA: Diagnosis not present

## 2021-10-27 DIAGNOSIS — N529 Male erectile dysfunction, unspecified: Secondary | ICD-10-CM

## 2021-10-27 DIAGNOSIS — E785 Hyperlipidemia, unspecified: Secondary | ICD-10-CM

## 2021-10-27 DIAGNOSIS — E1169 Type 2 diabetes mellitus with other specified complication: Secondary | ICD-10-CM

## 2021-10-27 DIAGNOSIS — N1831 Chronic kidney disease, stage 3a: Secondary | ICD-10-CM

## 2021-10-27 DIAGNOSIS — B372 Candidiasis of skin and nail: Secondary | ICD-10-CM | POA: Diagnosis not present

## 2021-10-27 DIAGNOSIS — Z125 Encounter for screening for malignant neoplasm of prostate: Secondary | ICD-10-CM | POA: Diagnosis not present

## 2021-10-27 DIAGNOSIS — N1832 Chronic kidney disease, stage 3b: Secondary | ICD-10-CM

## 2021-10-27 LAB — BAYER DCA HB A1C WAIVED: HB A1C (BAYER DCA - WAIVED): 6 % — ABNORMAL HIGH (ref 4.8–5.6)

## 2021-10-27 MED ORDER — NYSTATIN 100000 UNIT/GM EX POWD
1.0000 | Freq: Three times a day (TID) | CUTANEOUS | 1 refills | Status: DC
  Filled 2021-10-27: qty 60, 14d supply, fill #0

## 2021-10-27 MED ORDER — NYSTATIN 100000 UNIT/GM EX CREA
1.0000 | TOPICAL_CREAM | Freq: Every day | CUTANEOUS | 1 refills | Status: DC
  Filled 2021-10-27: qty 30, 30d supply, fill #0

## 2021-10-27 MED ORDER — EMPAGLIFLOZIN 25 MG PO TABS
25.0000 mg | ORAL_TABLET | Freq: Every day | ORAL | 3 refills | Status: DC
  Filled 2021-10-27: qty 90, 90d supply, fill #0
  Filled 2022-01-31: qty 30, 30d supply, fill #0
  Filled 2022-03-20: qty 90, 90d supply, fill #1
  Filled 2022-06-27: qty 90, 90d supply, fill #2
  Filled 2022-09-26: qty 30, 30d supply, fill #3

## 2021-10-27 MED ORDER — SILDENAFIL CITRATE 100 MG PO TABS
50.0000 mg | ORAL_TABLET | Freq: Every day | ORAL | 3 refills | Status: DC | PRN
  Filled 2021-10-27: qty 20, 20d supply, fill #0
  Filled 2021-11-21: qty 20, 20d supply, fill #1
  Filled 2021-12-20: qty 20, 20d supply, fill #2
  Filled 2022-07-10: qty 20, 20d supply, fill #3

## 2021-10-27 NOTE — Addendum Note (Signed)
Addended by: Arville Care on: 10/27/2021 08:39 AM   Modules accepted: Orders

## 2021-10-27 NOTE — Progress Notes (Signed)
BP 112/69   Pulse 83   Temp 98 F (36.7 C)   Ht '5\' 11"'  (1.803 m)   Wt 219 lb (99.3 kg)   SpO2 100%   BMI 30.54 kg/m    Subjective:   Patient ID: Matthew Bolton, male    DOB: Apr 08, 1955, 66 y.o.   MRN: [id number]  HPI: Matthew Bolton is a 66 y.o. male presenting on 10/27/2021 for Medical Management of Chronic Issues and Diabetes   HPI Type 2 diabetes mellitus Patient comes in today for recheck of his diabetes. Patient has been currently taking metformin and Januvia Jardiance. Patient is not currently on an ACE inhibitor/ARB. Patient has seen an ophthalmologist this year. Patient denies any issues with their feet. The symptom started onset as an adult hyperlipidemia and stage III kidney disease  ARE RELATED TO DM.  Patient's CKD is stable we will recheck with labs  Hyperlipidemia Patient is coming in for recheck of his hyperlipidemia. The patient is currently taking atorvastatin. They deny any issues with myalgias or history of liver damage from it. They deny any focal numbness or weakness or chest pain.   Relevant past medical, surgical, family and social history reviewed and updated as indicated. Interim medical history since our last visit reviewed. Allergies and medications reviewed and updated.  Review of Systems  Constitutional:  Negative for chills and fever.  Eyes:  Negative for visual disturbance.  Respiratory:  Negative for shortness of breath and wheezing.   Cardiovascular:  Negative for chest pain and leg swelling.  Musculoskeletal:  Negative for back pain and gait problem.  Skin:  Negative for rash.  Neurological:  Negative for dizziness, weakness and light-headedness.  All other systems reviewed and are negative.   Per HPI unless specifically indicated above   Allergies as of 10/27/2021   No Known Allergies      Medication List        Accurate as of October 27, 2021  8:36 AM. If you have any questions, ask your nurse or doctor.           acetaminophen 325 MG tablet Commonly known as: TYLENOL Take 650 mg by mouth every 6 (six) hours as needed.   atorvastatin 20 MG tablet Commonly known as: LIPITOR TAKE 1 TABLET BY MOUTH ONCE A DAY   cholecalciferol 25 MCG (1000 UNIT) tablet Commonly known as: VITAMIN D3 Take 1,000 Units by mouth daily.   colchicine 0.6 MG tablet Day 1: Take 1.2 mg at the first sign of flare, followed in 1 hour with a single dose of 0.6 mg. Then 1 tablet twice daily until gout resolved.   empagliflozin 25 MG Tabs tablet Commonly known as: Jardiance Take 1 tablet (25 mg total) by mouth daily before breakfast.   Januvia 100 MG tablet Generic drug: sitaGLIPtin TAKE 1 TABLET BY MOUTH ONCE A DAY   metFORMIN 500 MG tablet Commonly known as: GLUCOPHAGE TAKE 1 TABLET BY MOUTH 2 TIMES DAILY WITH A MEAL   nystatin powder Commonly known as: MYCOSTATIN/NYSTOP Apply 1 Application topically 3 (three) times daily.   nystatin cream Commonly known as: MYCOSTATIN Apply 1 Application topically at bedtime.   Viagra 100 MG tablet Generic drug: sildenafil Take 1/2 - 1 tablet (50-100 mg total) by mouth daily as needed for erectile dysfunction.   zinc gluconate 50 MG tablet Take 50 mg by mouth daily.         Objective:   BP 112/69   Pulse 83   Temp 98 F (  36.7 C)   Ht '5\' 11"'  (1.803 m)   Wt 219 lb (99.3 kg)   SpO2 100%   BMI 30.54 kg/m   Wt Readings from Last 3 Encounters:  10/27/21 219 lb (99.3 kg)  08/09/21 223 lb 4 oz (101.3 kg)  07/14/21 224 lb (101.6 kg)    Physical Exam Vitals and nursing note reviewed.  Constitutional:      General: He is not in acute distress.    Appearance: He is well-developed. He is not diaphoretic.  Eyes:     General: No scleral icterus.    Conjunctiva/sclera: Conjunctivae normal.  Neck:     Thyroid: No thyromegaly.  Cardiovascular:     Rate and Rhythm: Normal rate and regular rhythm.     Heart sounds: Normal heart sounds. No murmur heard. Pulmonary:      Effort: Pulmonary effort is normal. No respiratory distress.     Breath sounds: Normal breath sounds. No wheezing.  Musculoskeletal:        General: No swelling. Normal range of motion.     Cervical back: Neck supple.  Lymphadenopathy:     Cervical: No cervical adenopathy.  Skin:    General: Skin is warm and dry.     Findings: No rash.  Neurological:     Mental Status: He is alert and oriented to person, place, and time.     Coordination: Coordination normal.  Psychiatric:        Behavior: Behavior normal.     Results for orders placed or performed in visit on 08/25/21  HM DIABETES EYE EXAM  Result Value Ref Range   HM Diabetic Eye Exam No Retinopathy No Retinopathy    Assessment & Plan:   Problem List Items Addressed This Visit       Endocrine   Type 2 diabetes mellitus with stage 3a chronic kidney disease, without long-term current use of insulin (HCC) - Primary   Relevant Medications   empagliflozin (JARDIANCE) 25 MG TABS tablet   Other Relevant Orders   CBC with Differential/Platelet   CMP14+EGFR   Lipid panel   Bayer DCA Hb A1c Waived   Microalbumin / creatinine urine ratio   Hyperlipidemia associated with type 2 diabetes mellitus (HCC)   Relevant Medications   empagliflozin (JARDIANCE) 25 MG TABS tablet   Other Relevant Orders   CBC with Differential/Platelet   CMP14+EGFR   Lipid panel   Bayer DCA Hb A1c Waived     Genitourinary   Stage 3b chronic kidney disease ([place])   Other Visit Diagnoses     Prostate cancer screening       Relevant Orders   PSA, total and free   Yeast dermatitis       Relevant Medications   nystatin (MYCOSTATIN/NYSTOP) powder   nystatin cream (MYCOSTATIN)       A1c looks good at 6.0.  Blood pressure looks good.  No changes Follow up plan: Return in about 3 months (around 01/27/2022), or if symptoms worsen or fail to improve, for Diabetes and cholesterol.  Counseling provided for all of the vaccine components Orders  Placed This Encounter  Procedures   CBC with Differential/Platelet   CMP14+EGFR   Lipid panel   Bayer DCA Hb A1c Waived   PSA, total and free   Microalbumin / creatinine urine ratio    Caryl Pina, MD [facility] 10/27/2021, 8:36 AM

## 2021-10-28 LAB — CBC WITH DIFFERENTIAL/PLATELET
Basophils Absolute: 0 10*3/uL (ref 0.0–0.2)
Basos: 1 %
EOS (ABSOLUTE): 0.2 10*3/uL (ref 0.0–0.4)
Eos: 3 %
Hematocrit: 52.2 % — ABNORMAL HIGH (ref 37.5–51.0)
Hemoglobin: 17.3 g/dL (ref 13.0–17.7)
Immature Grans (Abs): 0 10*3/uL (ref 0.0–0.1)
Immature Granulocytes: 0 %
Lymphocytes Absolute: 1.2 10*3/uL (ref 0.7–3.1)
Lymphs: 21 %
MCH: 29.6 pg (ref 26.6–33.0)
MCHC: 33.1 g/dL (ref 31.5–35.7)
MCV: 89 fL (ref 79–97)
Monocytes Absolute: 0.4 10*3/uL (ref 0.1–0.9)
Monocytes: 7 %
Neutrophils Absolute: 3.9 10*3/uL (ref 1.4–7.0)
Neutrophils: 68 %
Platelets: 132 10*3/uL — ABNORMAL LOW (ref 150–450)
RBC: 5.85 x10E6/uL — ABNORMAL HIGH (ref 4.14–5.80)
RDW: 14.5 % (ref 11.6–15.4)
WBC: 5.6 10*3/uL (ref 3.4–10.8)

## 2021-10-28 LAB — PSA, TOTAL AND FREE
PSA, Free Pct: 25 %
PSA, Free: 1.25 ng/mL
Prostate Specific Ag, Serum: 5 ng/mL — ABNORMAL HIGH (ref 0.0–4.0)

## 2021-10-28 LAB — CMP14+EGFR
ALT: 23 IU/L (ref 0–44)
AST: 17 IU/L (ref 0–40)
Albumin/Globulin Ratio: 1.5 (ref 1.2–2.2)
Albumin: 4.2 g/dL (ref 3.9–4.9)
Alkaline Phosphatase: 88 IU/L (ref 44–121)
BUN/Creatinine Ratio: 17 (ref 10–24)
BUN: 24 mg/dL (ref 8–27)
Bilirubin Total: 0.5 mg/dL (ref 0.0–1.2)
CO2: 21 mmol/L (ref 20–29)
Calcium: 9.4 mg/dL (ref 8.6–10.2)
Chloride: 102 mmol/L (ref 96–106)
Creatinine, Ser: 1.43 mg/dL — ABNORMAL HIGH (ref 0.76–1.27)
Globulin, Total: 2.8 g/dL (ref 1.5–4.5)
Glucose: 165 mg/dL — ABNORMAL HIGH (ref 70–99)
Potassium: 4.5 mmol/L (ref 3.5–5.2)
Sodium: 137 mmol/L (ref 134–144)
Total Protein: 7 g/dL (ref 6.0–8.5)
eGFR: 55 mL/min/{1.73_m2} — ABNORMAL LOW (ref >59)

## 2021-10-28 LAB — LIPID PANEL
Chol/HDL Ratio: 3.9 ratio (ref 0.0–5.0)
Cholesterol, Total: 149 mg/dL (ref 100–199)
HDL: 38 mg/dL — ABNORMAL LOW (ref >39)
LDL Chol Calc (NIH): 77 mg/dL (ref 0–99)
Triglycerides: 203 mg/dL — ABNORMAL HIGH (ref 0–149)
VLDL Cholesterol Cal: 34 mg/dL (ref 5–40)

## 2021-10-28 LAB — MICROALBUMIN / CREATININE URINE RATIO
Creatinine, Urine: 54.9 mg/dL
Microalb/Creat Ratio: <5 mg/g creat (ref 0–29)
Microalbumin, Urine: <3 ug/mL

## 2021-10-30 ENCOUNTER — Other Ambulatory Visit (HOSPITAL_COMMUNITY): Payer: Self-pay

## 2021-11-07 ENCOUNTER — Encounter: Payer: Self-pay | Admitting: Family Medicine

## 2021-11-07 ENCOUNTER — Ambulatory Visit (INDEPENDENT_AMBULATORY_CARE_PROVIDER_SITE_OTHER): Payer: No Typology Code available for payment source | Admitting: Family Medicine

## 2021-11-07 ENCOUNTER — Other Ambulatory Visit (HOSPITAL_COMMUNITY): Payer: Self-pay

## 2021-11-07 VITALS — BP 115/69 | HR 76 | Temp 97.5°F | Ht 71.0 in | Wt 220.8 lb

## 2021-11-07 DIAGNOSIS — L02211 Cutaneous abscess of abdominal wall: Secondary | ICD-10-CM

## 2021-11-07 MED ORDER — SULFAMETHOXAZOLE-TRIMETHOPRIM 800-160 MG PO TABS
1.0000 | ORAL_TABLET | Freq: Two times a day (BID) | ORAL | 0 refills | Status: AC
  Filled 2021-11-07: qty 14, 7d supply, fill #0

## 2021-11-07 NOTE — Progress Notes (Signed)
Assessment & Plan:  1. Abscess of skin of abdomen Education provided on abscesses.  - sulfamethoxazole-trimethoprim (BACTRIM DS) 800-160 MG tablet; Take 1 tablet by mouth 2 (two) times daily for 7 days.  Dispense: 14 tablet; Refill: 0   Follow up plan: Return if symptoms worsen or fail to improve.  Deliah Boston, MSN, APRN, FNP-C [facility]  Subjective:   Patient ID: Matthew Bolton, male    DOB: 22-Dec-1955, 66 y.o.   MRN: [id number]  HPI: Matthew Bolton is a 66 y.o. male presenting on 11/07/2021 for Recurrent Skin Infections (Boil right on right lower abdomen x 3-4 days- painful )  Patient reports a boil on the right side of his lower abdomen that appeared 3-4 days ago. It is painful. Unsure if he was bitten by something. He has been applying Neosporin. He is a diabetic, but is well controlled with an A1c of 6.0.   ROS: Negative unless specifically indicated above in HPI.   Relevant past medical history reviewed and updated as indicated.   Allergies and medications reviewed and updated.   Current Outpatient Medications:    acetaminophen (TYLENOL) 325 MG tablet, Take 650 mg by mouth every 6 (six) hours as needed., Disp: , Rfl:    atorvastatin (LIPITOR) 20 MG tablet, TAKE 1 TABLET BY MOUTH ONCE A DAY, Disp: 90 tablet, Rfl: 3   cholecalciferol (VITAMIN D3) 25 MCG (1000 UNIT) tablet, Take 1,000 Units by mouth daily., Disp: , Rfl:    colchicine 0.6 MG tablet, Day 1: Take 1.2 mg at the first sign of flare, followed in 1 hour with a single dose of 0.6 mg. Then 1 tablet twice daily until gout resolved., Disp: 60 tablet, Rfl: 1   empagliflozin (JARDIANCE) 25 MG TABS tablet, Take 1 tablet by mouth daily before breakfast., Disp: 90 tablet, Rfl: 3   metFORMIN (GLUCOPHAGE) 500 MG tablet, TAKE 1 TABLET BY MOUTH 2 TIMES DAILY WITH A MEAL, Disp: 180 tablet, Rfl: 3   nystatin (MYCOSTATIN/NYSTOP) powder, Apply topically 3 times daily., Disp: 60 g, Rfl: 1   nystatin  cream (MYCOSTATIN), Apply topically at bedtime as directed, Disp: 30 g, Rfl: 1   sildenafil (VIAGRA) 100 MG tablet, Take 1/2 - 1 tablet by mouth daily as needed for erectile dysfunction., Disp: 20 tablet, Rfl: 3   sitaGLIPtin (JANUVIA) 100 MG tablet, TAKE 1 TABLET BY MOUTH ONCE A DAY, Disp: 90 tablet, Rfl: 3   zinc gluconate 50 MG tablet, Take 50 mg by mouth daily., Disp: , Rfl:   No Known Allergies  Objective:   BP 115/69   Pulse 76   Temp (!) 97.5 F (36.4 C) (Temporal)   Ht 5\' 11"  (1.803 m)   Wt 220 lb 12.8 oz (100.2 kg)   SpO2 96%   BMI 30.80 kg/m    Physical Exam Vitals reviewed.  Constitutional:      General: He is not in acute distress.    Appearance: Normal appearance. He is not ill-appearing, toxic-appearing or diaphoretic.  HENT:     Head: Normocephalic and atraumatic.  Eyes:     General: No scleral icterus.       Right eye: No discharge.        Left eye: No discharge.     Conjunctiva/sclera: Conjunctivae normal.  Cardiovascular:     Rate and Rhythm: Normal rate.  Pulmonary:     Effort: Pulmonary effort is normal. No respiratory distress.  Musculoskeletal:        General: Normal range  of motion.     Cervical back: Normal range of motion.  Skin:    General: Skin is warm and dry.     Findings: Abscess (right lower abdomen with warmth and erythema) present.  Neurological:     Mental Status: He is alert and oriented to person, place, and time. Mental status is at baseline.  Psychiatric:        Mood and Affect: Mood normal.        Behavior: Behavior normal.        Thought Content: Thought content normal.        Judgment: Judgment normal.

## 2021-11-21 ENCOUNTER — Other Ambulatory Visit (HOSPITAL_COMMUNITY): Payer: Self-pay

## 2021-11-21 ENCOUNTER — Telehealth: Payer: Self-pay | Admitting: Family Medicine

## 2021-11-21 DIAGNOSIS — Z1211 Encounter for screening for malignant neoplasm of colon: Secondary | ICD-10-CM

## 2021-11-21 NOTE — Telephone Encounter (Signed)
Pt called requesting that Dr Charolotte Eke order cologuard for him because he received letter in the mail saying he was due to have one again.

## 2021-11-21 NOTE — Telephone Encounter (Signed)
Ordered

## 2021-11-23 ENCOUNTER — Other Ambulatory Visit (HOSPITAL_COMMUNITY): Payer: Self-pay

## 2021-12-01 LAB — COLOGUARD: COLOGUARD: NEGATIVE

## 2021-12-19 ENCOUNTER — Encounter: Payer: Self-pay | Admitting: *Deleted

## 2021-12-20 ENCOUNTER — Other Ambulatory Visit (HOSPITAL_COMMUNITY): Payer: Self-pay

## 2021-12-21 ENCOUNTER — Other Ambulatory Visit (HOSPITAL_COMMUNITY): Payer: Self-pay

## 2022-01-02 ENCOUNTER — Other Ambulatory Visit (HOSPITAL_COMMUNITY): Payer: Self-pay

## 2022-01-22 ENCOUNTER — Encounter: Payer: Self-pay | Admitting: Family Medicine

## 2022-01-22 ENCOUNTER — Ambulatory Visit (INDEPENDENT_AMBULATORY_CARE_PROVIDER_SITE_OTHER): Payer: Medicare Other | Admitting: Family Medicine

## 2022-01-22 VITALS — BP 115/73 | HR 72 | Ht 71.0 in | Wt 221.0 lb

## 2022-01-22 DIAGNOSIS — Z23 Encounter for immunization: Secondary | ICD-10-CM | POA: Diagnosis not present

## 2022-01-22 DIAGNOSIS — N1831 Chronic kidney disease, stage 3a: Secondary | ICD-10-CM

## 2022-01-22 DIAGNOSIS — N1832 Chronic kidney disease, stage 3b: Secondary | ICD-10-CM

## 2022-01-22 DIAGNOSIS — E1169 Type 2 diabetes mellitus with other specified complication: Secondary | ICD-10-CM | POA: Diagnosis not present

## 2022-01-22 DIAGNOSIS — E785 Hyperlipidemia, unspecified: Secondary | ICD-10-CM

## 2022-01-22 DIAGNOSIS — E1122 Type 2 diabetes mellitus with diabetic chronic kidney disease: Secondary | ICD-10-CM | POA: Diagnosis not present

## 2022-01-22 LAB — BAYER DCA HB A1C WAIVED: HB A1C (BAYER DCA - WAIVED): 6.5 % — ABNORMAL HIGH (ref 4.8–5.6)

## 2022-01-22 NOTE — Progress Notes (Signed)
BP 115/73   Pulse 72   Ht _0  (1.803 m)   Wt 221 lb (100.2 kg)   SpO2 95%   BMI 30.82 kg/m    Subjective:   Patient ID: Matthew Bolton, male    DOB: 08/31/1955, 66 y.o.   MRN: [id number]  HPI: Matthew Bolton is a 66 y.o. male presenting on 01/22/2022 for Medical Management of Chronic Issues   HPI Type 2 diabetes mellitus Patient comes in today for recheck of his diabetes. Patient has been currently taking Januvia and metformin and Jardiance. Patient is not currently on an ACE inhibitor/ARB. Patient has seen an ophthalmologist this year. Patient denies any issues with their feet. The symptom started onset as an adult hyperlipidemia and CKD ARE RELATED TO DM   Hyperlipidemia Patient is coming in for recheck of his hyperlipidemia. The patient is currently taking atorvastatin. They deny any issues with myalgias or history of liver damage from it. They deny any focal numbness or weakness or chest pain.   Relevant past medical, surgical, family and social history reviewed and updated as indicated. Interim medical history since our last visit reviewed. Allergies and medications reviewed and updated.  Review of Systems  Constitutional:  Negative for chills and fever.  Eyes:  Negative for visual disturbance.  Respiratory:  Negative for shortness of breath and wheezing.   Cardiovascular:  Negative for chest pain and leg swelling.  Musculoskeletal:  Negative for back pain and gait problem.  Skin:  Negative for rash.  Neurological:  Negative for dizziness, weakness and light-headedness.  All other systems reviewed and are negative.   Per HPI unless specifically indicated above   Allergies as of 01/22/2022   No Known Allergies      Medication List        Accurate as of January 22, 2022 10:31 AM. If you have any questions, ask your nurse or doctor.          acetaminophen 325 MG tablet Commonly known as: TYLENOL Take 650 mg by mouth every 6 (six) hours as  needed.   atorvastatin 20 MG tablet Commonly known as: LIPITOR TAKE 1 TABLET BY MOUTH ONCE A DAY   cholecalciferol 25 MCG (1000 UNIT) tablet Commonly known as: VITAMIN D3 Take 1,000 Units by mouth daily.   colchicine 0.6 MG tablet Day 1: Take 1.2 mg at the first sign of flare, followed in 1 hour with a single dose of 0.6 mg. Then 1 tablet twice daily until gout resolved.   empagliflozin 25 MG Tabs tablet Commonly known as: Jardiance Take 1 tablet by mouth daily before breakfast.   Januvia 100 MG tablet Generic drug: sitaGLIPtin TAKE 1 TABLET BY MOUTH ONCE A DAY   metFORMIN 500 MG tablet Commonly known as: GLUCOPHAGE TAKE 1 TABLET BY MOUTH 2 TIMES DAILY WITH A MEAL   Nyamyc powder Generic drug: nystatin Apply topically 3 times daily.   nystatin cream Commonly known as: MYCOSTATIN Apply topically at bedtime as directed   sildenafil 100 MG tablet Commonly known as: Viagra Take 1/2 - 1 tablet by mouth daily as needed for erectile dysfunction.   zinc gluconate 50 MG tablet Take 50 mg by mouth daily.         Objective:   BP 115/73   Pulse 72   Ht _1  (1.803 m)   Wt 221 lb (100.2 kg)   SpO2 95%   BMI 30.82 kg/m   Wt Readings from Last 3 Encounters:  01/22/22 221 lb (100.2  kg)  11/07/21 220 lb 12.8 oz (100.2 kg)  10/27/21 219 lb (99.3 kg)    Physical Exam Vitals and nursing note reviewed.  Constitutional:      General: He is not in acute distress.    Appearance: He is well-developed. He is not diaphoretic.  Eyes:     General: No scleral icterus.    Conjunctiva/sclera: Conjunctivae normal.  Neck:     Thyroid: No thyromegaly.  Cardiovascular:     Rate and Rhythm: Normal rate and regular rhythm.     Heart sounds: Normal heart sounds. No murmur heard. Pulmonary:     Effort: Pulmonary effort is normal. No respiratory distress.     Breath sounds: Normal breath sounds. No wheezing.  Musculoskeletal:        General: No swelling. Normal range of motion.      Cervical back: Neck supple.  Lymphadenopathy:     Cervical: No cervical adenopathy.  Skin:    General: Skin is warm and dry.     Findings: No rash.  Neurological:     Mental Status: He is alert and oriented to person, place, and time.     Coordination: Coordination normal.  Psychiatric:        Behavior: Behavior normal.     Results for orders placed or performed in visit on 11/21/21  Cologuard  Result Value Ref Range   COLOGUARD Negative Negative    Assessment & Plan:   Problem List Items Addressed This Visit       Endocrine   Type 2 diabetes mellitus with stage 3a chronic kidney disease, without long-term current use of insulin ([place]) - Primary   Relevant Orders   Bayer DCA Hb A1c Waived   Lipid panel   CMP14+EGFR   CBC with Differential/Platelet   Hyperlipidemia associated with type 2 diabetes mellitus (HCC)     Genitourinary   Stage 3b chronic kidney disease (HCC)    A1c is 6.5 which is slightly up, mainly focus on diet but seems to be doing okay.  No changes from our end Blood pressure looks good and heart rate looks good and everything else looks good. Follow up plan: Return in about 3 months (around 04/24/2022), or if symptoms worsen or fail to improve, for Diabetes and hyperlipidemia and CKD.  Counseling provided for all of the vaccine components Orders Placed This Encounter  Procedures   Bayer [place] Hb A1c Waived   Lipid panel   CMP14+EGFR   CBC with Differential/Platelet    Caryl Pina, MD [facility] 01/22/2022, 10:31 AM

## 2022-01-23 LAB — CBC WITH DIFFERENTIAL/PLATELET
Basophils Absolute: 0 10*3/uL (ref 0.0–0.2)
Basos: 1 %
EOS (ABSOLUTE): 0.1 10*3/uL (ref 0.0–0.4)
Eos: 3 %
Hematocrit: 49.6 % (ref 37.5–51.0)
Hemoglobin: 16.5 g/dL (ref 13.0–17.7)
Immature Grans (Abs): 0 10*3/uL (ref 0.0–0.1)
Immature Granulocytes: 0 %
Lymphocytes Absolute: 1.3 10*3/uL (ref 0.7–3.1)
Lymphs: 25 %
MCH: 29.6 pg (ref 26.6–33.0)
MCHC: 33.3 g/dL (ref 31.5–35.7)
MCV: 89 fL (ref 79–97)
Monocytes Absolute: 0.4 10*3/uL (ref 0.1–0.9)
Monocytes: 8 %
Neutrophils Absolute: 3.4 10*3/uL (ref 1.4–7.0)
Neutrophils: 63 %
Platelets: 133 10*3/uL — ABNORMAL LOW (ref 150–450)
RBC: 5.57 x10E6/uL (ref 4.14–5.80)
RDW: 13.3 % (ref 11.6–15.4)
WBC: 5.3 10*3/uL (ref 3.4–10.8)

## 2022-01-23 LAB — CMP14+EGFR
ALT: 16 IU/L (ref 0–44)
AST: 20 IU/L (ref 0–40)
Albumin/Globulin Ratio: 1.7 (ref 1.2–2.2)
Albumin: 4.2 g/dL (ref 3.9–4.9)
Alkaline Phosphatase: 81 IU/L (ref 44–121)
BUN/Creatinine Ratio: 17 (ref 10–24)
BUN: 25 mg/dL (ref 8–27)
Bilirubin Total: 0.5 mg/dL (ref 0.0–1.2)
CO2: 22 mmol/L (ref 20–29)
Calcium: 8.8 mg/dL (ref 8.6–10.2)
Chloride: 103 mmol/L (ref 96–106)
Creatinine, Ser: 1.43 mg/dL — ABNORMAL HIGH (ref 0.76–1.27)
Globulin, Total: 2.5 g/dL (ref 1.5–4.5)
Glucose: 129 mg/dL — ABNORMAL HIGH (ref 70–99)
Potassium: 4.3 mmol/L (ref 3.5–5.2)
Sodium: 138 mmol/L (ref 134–144)
Total Protein: 6.7 g/dL (ref 6.0–8.5)
eGFR: 55 mL/min/{1.73_m2} — ABNORMAL LOW (ref >59)

## 2022-01-23 LAB — LIPID PANEL
Chol/HDL Ratio: 3.4 ratio (ref 0.0–5.0)
Cholesterol, Total: 132 mg/dL (ref 100–199)
HDL: 39 mg/dL — ABNORMAL LOW (ref >39)
LDL Chol Calc (NIH): 68 mg/dL (ref 0–99)
Triglycerides: 146 mg/dL (ref 0–149)
VLDL Cholesterol Cal: 25 mg/dL (ref 5–40)

## 2022-01-25 ENCOUNTER — Ambulatory Visit: Payer: No Typology Code available for payment source | Admitting: Family Medicine

## 2022-01-31 ENCOUNTER — Other Ambulatory Visit (HOSPITAL_COMMUNITY): Payer: Self-pay

## 2022-02-19 ENCOUNTER — Other Ambulatory Visit (HOSPITAL_COMMUNITY): Payer: Self-pay

## 2022-03-20 ENCOUNTER — Other Ambulatory Visit: Payer: Self-pay

## 2022-03-20 ENCOUNTER — Other Ambulatory Visit (HOSPITAL_COMMUNITY): Payer: Self-pay

## 2022-03-21 ENCOUNTER — Other Ambulatory Visit (HOSPITAL_COMMUNITY): Payer: Self-pay

## 2022-03-21 ENCOUNTER — Encounter (HOSPITAL_COMMUNITY): Payer: Self-pay

## 2022-03-21 ENCOUNTER — Other Ambulatory Visit: Payer: Self-pay

## 2022-03-26 ENCOUNTER — Other Ambulatory Visit (HOSPITAL_COMMUNITY): Payer: Self-pay

## 2022-03-27 ENCOUNTER — Other Ambulatory Visit (HOSPITAL_COMMUNITY): Payer: Self-pay

## 2022-03-28 ENCOUNTER — Other Ambulatory Visit (HOSPITAL_COMMUNITY): Payer: Self-pay

## 2022-03-29 ENCOUNTER — Other Ambulatory Visit (HOSPITAL_COMMUNITY): Payer: Self-pay

## 2022-04-12 ENCOUNTER — Other Ambulatory Visit: Payer: Self-pay

## 2022-04-12 ENCOUNTER — Encounter: Payer: Self-pay | Admitting: Family Medicine

## 2022-04-12 ENCOUNTER — Other Ambulatory Visit (HOSPITAL_COMMUNITY): Payer: Self-pay

## 2022-04-12 ENCOUNTER — Encounter: Payer: Self-pay | Admitting: Pharmacist

## 2022-04-12 ENCOUNTER — Ambulatory Visit (INDEPENDENT_AMBULATORY_CARE_PROVIDER_SITE_OTHER): Payer: Medicare Other | Admitting: Family Medicine

## 2022-04-12 VITALS — BP 113/72 | HR 77 | Temp 98.2°F | Ht 71.0 in | Wt 224.0 lb

## 2022-04-12 DIAGNOSIS — L089 Local infection of the skin and subcutaneous tissue, unspecified: Secondary | ICD-10-CM

## 2022-04-12 DIAGNOSIS — L723 Sebaceous cyst: Secondary | ICD-10-CM

## 2022-04-12 MED ORDER — SULFAMETHOXAZOLE-TRIMETHOPRIM 800-160 MG PO TABS
1.0000 | ORAL_TABLET | Freq: Two times a day (BID) | ORAL | 0 refills | Status: DC
  Filled 2022-04-12: qty 20, 10d supply, fill #0

## 2022-04-12 NOTE — Progress Notes (Signed)
BP 113/72   Pulse 77   Temp 98.2 F (36.8 C)   Ht 5\' 11"  (1.803 m)   Wt 224 lb (101.6 kg)   SpO2 95%   BMI 31.24 kg/m    Subjective:   Patient ID: Matthew Bolton, male    DOB: October 24, 1955, 67 y.o.   MRN: [id number]  HPI: Matthew Bolton is a 67 y.o. male presenting on 04/12/2022 for ruptured cyst (Right upper back, present for years, flares on and off)   HPI Patient has a cyst on his right upper back that has been there for years but also more recently swollen up causing a lot of pain became very red and inflamed.  He says this started about 3 or 4 days ago and then just yesterday it burst and started draining he got some relief from that but is coming in because it is still somewhat painful and red around it.  He said the drainage was very purulent and thick white and green.  Relevant past medical, surgical, family and social history reviewed and updated as indicated. Interim medical history since our last visit reviewed. Allergies and medications reviewed and updated.  Review of Systems  Constitutional:  Negative for chills and fever.  Respiratory:  Negative for shortness of breath and wheezing.   Cardiovascular:  Negative for chest pain and leg swelling.  Skin:  Positive for color change and wound. Negative for rash.  All other systems reviewed and are negative.   Per HPI unless specifically indicated above   Allergies as of 04/12/2022   No Known Allergies      Medication List        Accurate as of April 12, 2022  3:02 PM. If you have any questions, ask your nurse or doctor.          acetaminophen 325 MG tablet Commonly known as: TYLENOL Take 650 mg by mouth every 6 (six) hours as needed.   atorvastatin 20 MG tablet Commonly known as: LIPITOR TAKE 1 TABLET BY MOUTH ONCE A DAY   cholecalciferol 25 MCG (1000 UNIT) tablet Commonly known as: VITAMIN D3 Take 1,000 Units by mouth daily.   colchicine 0.6 MG tablet Day 1: Take 1.2 mg at the first sign  of flare, followed in 1 hour with a single dose of 0.6 mg. Then 1 tablet twice daily until gout resolved.   Januvia 100 MG tablet Generic drug: sitaGLIPtin TAKE 1 TABLET BY MOUTH ONCE A DAY   Jardiance 25 MG Tabs tablet Generic drug: empagliflozin Take 1 tablet by mouth daily before breakfast.   metFORMIN 500 MG tablet Commonly known as: GLUCOPHAGE TAKE 1 TABLET BY MOUTH 2 TIMES DAILY WITH A MEAL   Nyamyc powder Generic drug: nystatin Apply topically 3 times daily.   nystatin cream Commonly known as: MYCOSTATIN Apply topically at bedtime as directed   sildenafil 100 MG tablet Commonly known as: Viagra Take 1/2 - 1 tablet by mouth daily as needed for erectile dysfunction.   sulfamethoxazole-trimethoprim 800-160 MG tablet Commonly known as: BACTRIM DS Take 1 tablet by mouth 2 (two) times daily. Home delivery per patient Started by: Fransisca Kaufmann Dhara Schepp, MD   zinc gluconate 50 MG tablet Take 50 mg by mouth daily.         Objective:   BP 113/72   Pulse 77   Temp 98.2 F (36.8 C)   Ht 5\' 11"  (1.803 m)   Wt 224 lb (101.6 kg)   SpO2 95%   BMI 31.24  kg/m   Wt Readings from Last 3 Encounters:  04/12/22 224 lb (101.6 kg)  01/22/22 221 lb (100.2 kg)  11/07/21 220 lb 12.8 oz (100.2 kg)    Physical Exam Vitals and nursing note reviewed.  Constitutional:      General: He is not in acute distress.    Appearance: He is not diaphoretic.  Skin:    General: Skin is warm and dry.     Findings: Erythema and lesion (Open cystic area with 0.25 cm area that is open with surrounding erythema of 2 and a half centimeters in area around it.) present.  Neurological:     Mental Status: He is alert.       Assessment & Plan:   Problem List Items Addressed This Visit   None Visit Diagnoses     Infected sebaceous cyst    -  Primary   Relevant Medications   sulfamethoxazole-trimethoprim (BACTRIM DS) 800-160 MG tablet       Cyst is already draining.  Will treat with  antibiotic because of surrounding erythema and likely infection. Follow up plan: Return if symptoms worsen or fail to improve.  Counseling provided for all of the vaccine components No orders of the defined types were placed in this encounter.   Caryl Pina, MD [facility] 04/12/2022, 3:02 PM

## 2022-04-13 ENCOUNTER — Other Ambulatory Visit (HOSPITAL_COMMUNITY): Payer: Self-pay

## 2022-04-13 ENCOUNTER — Other Ambulatory Visit: Payer: Self-pay | Admitting: Family Medicine

## 2022-04-13 ENCOUNTER — Other Ambulatory Visit: Payer: Self-pay

## 2022-04-13 DIAGNOSIS — N1831 Chronic kidney disease, stage 3a: Secondary | ICD-10-CM

## 2022-04-13 DIAGNOSIS — E1169 Type 2 diabetes mellitus with other specified complication: Secondary | ICD-10-CM

## 2022-04-13 MED ORDER — METFORMIN HCL 500 MG PO TABS
500.0000 mg | ORAL_TABLET | Freq: Two times a day (BID) | ORAL | 0 refills | Status: DC
  Filled 2022-04-13: qty 180, 90d supply, fill #0

## 2022-04-13 MED ORDER — ATORVASTATIN CALCIUM 20 MG PO TABS
20.0000 mg | ORAL_TABLET | Freq: Every day | ORAL | 0 refills | Status: DC
  Filled 2022-04-13: qty 90, 90d supply, fill #0

## 2022-04-14 ENCOUNTER — Other Ambulatory Visit (HOSPITAL_COMMUNITY): Payer: Self-pay

## 2022-04-24 ENCOUNTER — Other Ambulatory Visit: Payer: Self-pay | Admitting: Family Medicine

## 2022-04-24 DIAGNOSIS — N1831 Chronic kidney disease, stage 3a: Secondary | ICD-10-CM

## 2022-04-25 ENCOUNTER — Other Ambulatory Visit: Payer: Self-pay

## 2022-04-25 ENCOUNTER — Other Ambulatory Visit (HOSPITAL_COMMUNITY): Payer: Self-pay

## 2022-04-25 MED ORDER — SITAGLIPTIN PHOSPHATE 100 MG PO TABS
100.0000 mg | ORAL_TABLET | Freq: Every day | ORAL | 0 refills | Status: DC
  Filled 2022-04-25: qty 30, 30d supply, fill #0
  Filled 2022-05-25: qty 30, 30d supply, fill #1
  Filled 2022-06-27: qty 30, 30d supply, fill #2

## 2022-04-26 ENCOUNTER — Other Ambulatory Visit: Payer: Self-pay

## 2022-04-30 ENCOUNTER — Ambulatory Visit (INDEPENDENT_AMBULATORY_CARE_PROVIDER_SITE_OTHER): Payer: Medicare Other | Admitting: Family Medicine

## 2022-04-30 ENCOUNTER — Encounter: Payer: Self-pay | Admitting: Family Medicine

## 2022-04-30 VITALS — BP 105/69 | HR 75 | Ht 71.0 in | Wt 224.0 lb

## 2022-04-30 DIAGNOSIS — N1831 Chronic kidney disease, stage 3a: Secondary | ICD-10-CM | POA: Diagnosis not present

## 2022-04-30 DIAGNOSIS — N1832 Chronic kidney disease, stage 3b: Secondary | ICD-10-CM

## 2022-04-30 DIAGNOSIS — E785 Hyperlipidemia, unspecified: Secondary | ICD-10-CM | POA: Diagnosis not present

## 2022-04-30 DIAGNOSIS — E1169 Type 2 diabetes mellitus with other specified complication: Secondary | ICD-10-CM | POA: Diagnosis not present

## 2022-04-30 DIAGNOSIS — Z23 Encounter for immunization: Secondary | ICD-10-CM

## 2022-04-30 DIAGNOSIS — E1122 Type 2 diabetes mellitus with diabetic chronic kidney disease: Secondary | ICD-10-CM

## 2022-04-30 LAB — CMP14+EGFR
ALT: 27 IU/L (ref 0–44)
AST: 20 IU/L (ref 0–40)
Albumin/Globulin Ratio: 1.5 (ref 1.2–2.2)
Albumin: 4.1 g/dL (ref 3.9–4.9)
Alkaline Phosphatase: 83 IU/L (ref 44–121)
BUN/Creatinine Ratio: 16 (ref 10–24)
BUN: 22 mg/dL (ref 8–27)
Bilirubin Total: 0.4 mg/dL (ref 0.0–1.2)
CO2: 21 mmol/L (ref 20–29)
Calcium: 9.4 mg/dL (ref 8.6–10.2)
Chloride: 104 mmol/L (ref 96–106)
Creatinine, Ser: 1.39 mg/dL — ABNORMAL HIGH (ref 0.76–1.27)
Globulin, Total: 2.7 g/dL (ref 1.5–4.5)
Glucose: 141 mg/dL — ABNORMAL HIGH (ref 70–99)
Potassium: 4.5 mmol/L (ref 3.5–5.2)
Sodium: 140 mmol/L (ref 134–144)
Total Protein: 6.8 g/dL (ref 6.0–8.5)
eGFR: 56 mL/min/{1.73_m2} — ABNORMAL LOW (ref >59)

## 2022-04-30 LAB — CBC WITH DIFFERENTIAL/PLATELET
Basophils Absolute: 0.1 10*3/uL (ref 0.0–0.2)
Basos: 1 %
EOS (ABSOLUTE): 0.2 10*3/uL (ref 0.0–0.4)
Eos: 3 %
Hematocrit: 50.2 % (ref 37.5–51.0)
Hemoglobin: 16.7 g/dL (ref 13.0–17.7)
Immature Grans (Abs): 0 10*3/uL (ref 0.0–0.1)
Immature Granulocytes: 0 %
Lymphocytes Absolute: 1.4 10*3/uL (ref 0.7–3.1)
Lymphs: 27 %
MCH: 29.5 pg (ref 26.6–33.0)
MCHC: 33.3 g/dL (ref 31.5–35.7)
MCV: 89 fL (ref 79–97)
Monocytes Absolute: 0.4 10*3/uL (ref 0.1–0.9)
Monocytes: 7 %
Neutrophils Absolute: 3.2 10*3/uL (ref 1.4–7.0)
Neutrophils: 62 %
Platelets: 140 10*3/uL — ABNORMAL LOW (ref 150–450)
RBC: 5.67 x10E6/uL (ref 4.14–5.80)
RDW: 13.1 % (ref 11.6–15.4)
WBC: 5.1 10*3/uL (ref 3.4–10.8)

## 2022-04-30 LAB — LIPID PANEL
Chol/HDL Ratio: 4.1 ratio (ref 0.0–5.0)
Cholesterol, Total: 156 mg/dL (ref 100–199)
HDL: 38 mg/dL — ABNORMAL LOW (ref >39)
LDL Chol Calc (NIH): 82 mg/dL (ref 0–99)
Triglycerides: 213 mg/dL — ABNORMAL HIGH (ref 0–149)
VLDL Cholesterol Cal: 36 mg/dL (ref 5–40)

## 2022-04-30 LAB — BAYER DCA HB A1C WAIVED: HB A1C (BAYER DCA - WAIVED): 6.8 % — ABNORMAL HIGH (ref 4.8–5.6)

## 2022-04-30 NOTE — Progress Notes (Addendum)
BP 105/69   Pulse 75   Ht '5\' 11"'$  (1.803 m)   Wt 224 lb (101.6 kg)   SpO2 94%   BMI 31.24 kg/m    Subjective:   Patient ID: Matthew Bolton, male    DOB: 10/08/1955, 67 y.o.   MRN: [id number]  HPI: Matthew Bolton is a 67 y.o. male presenting on 04/30/2022 for Diabetes and Medical Management of Chronic Issues   HPI Type 2 diabetes mellitus Patient comes in today for recheck of his diabetes. Patient has been currently taking Jardiance and metformin and Januvia. Patient is not currently on an ACE inhibitor/ARB. Patient has not seen an ophthalmologist this year. Patient denies any issues with their feet. The symptom started onset as an adult hyperlipidemia and CKD ARE RELATED TO DM   Hyperlipidemia Patient is coming in for recheck of his hyperlipidemia. The patient is currently taking atorvastatin. They deny any issues with myalgias or history of liver damage from it. They deny any focal numbness or weakness or chest pain.   States she has CKD with diabetes Patient is coming in for stage III CKD recheck with diabetes.  He has been stable.  He denies any urinary symptoms.  He is not on an ACE or ARB because of low blood pressure.  He is on [place].  Relevant past medical, surgical, family and social history reviewed and updated as indicated. Interim medical history since our last visit reviewed. Allergies and medications reviewed and updated.  Review of Systems  Constitutional:  Negative for chills and fever.  Eyes:  Negative for visual disturbance.  Respiratory:  Negative for shortness of breath and wheezing.   Cardiovascular:  Negative for chest pain and leg swelling.  Musculoskeletal:  Negative for back pain and gait problem.  Skin:  Negative for rash.  All other systems reviewed and are negative.   Per HPI unless specifically indicated above   Allergies as of 04/30/2022   No Known Allergies      Medication List        Accurate as of April 30, 2022 11:59 PM. If  you have any questions, ask your nurse or doctor.          STOP taking these medications    Nyamyc powder Generic drug: nystatin Stopped by: Worthy Rancher, MD   nystatin cream Commonly known as: MYCOSTATIN Stopped by: Fransisca Kaufmann Tran Arzuaga, MD   sulfamethoxazole-trimethoprim 800-160 MG tablet Commonly known as: BACTRIM DS Stopped by: Fransisca Kaufmann Marisa Hage, MD       TAKE these medications    acetaminophen 325 MG tablet Commonly known as: TYLENOL Take 650 mg by mouth every 6 (six) hours as needed.   atorvastatin 20 MG tablet Commonly known as: LIPITOR Take 1 tablet (20 mg total) by mouth daily.   cholecalciferol 25 MCG (1000 UNIT) tablet Commonly known as: VITAMIN D3 Take 1,000 Units by mouth daily.   colchicine 0.6 MG tablet Day 1: Take 1.2 mg at the first sign of flare, followed in 1 hour with a single dose of 0.6 mg. Then 1 tablet twice daily until gout resolved.   Januvia 100 MG tablet Generic drug: sitaGLIPtin Take 1 tablet (100 mg total) by mouth daily.   Jardiance 25 MG Tabs tablet Generic drug: empagliflozin Take 1 tablet by mouth daily before breakfast.   metFORMIN 500 MG tablet Commonly known as: GLUCOPHAGE Take 1 tablet (500 mg total) by mouth 2 (two) times daily with a meal.   sildenafil 100 MG tablet Commonly  known as: Viagra Take 1/2 - 1 tablet by mouth daily as needed for erectile dysfunction.   zinc gluconate 50 MG tablet Take 50 mg by mouth daily.         Objective:   BP 105/69   Pulse 75   Ht '5\' 11"'$  (1.803 m)   Wt 224 lb (101.6 kg)   SpO2 94%   BMI 31.24 kg/m   Wt Readings from Last 3 Encounters:  04/30/22 224 lb (101.6 kg)  04/12/22 224 lb (101.6 kg)  01/22/22 221 lb (100.2 kg)    Physical Exam Vitals and nursing note reviewed.  Constitutional:      General: He is not in acute distress.    Appearance: He is well-developed. He is not diaphoretic.  Eyes:     General: No scleral icterus.    Conjunctiva/sclera:  Conjunctivae normal.  Neck:     Thyroid: No thyromegaly.  Cardiovascular:     Rate and Rhythm: Normal rate and regular rhythm.     Heart sounds: Normal heart sounds. No murmur heard. Pulmonary:     Effort: Pulmonary effort is normal. No respiratory distress.     Breath sounds: Normal breath sounds. No wheezing.  Musculoskeletal:        General: No swelling. Normal range of motion.     Cervical back: Neck supple.  Lymphadenopathy:     Cervical: No cervical adenopathy.  Skin:    General: Skin is warm and dry.     Findings: No rash.  Neurological:     Mental Status: He is alert and oriented to person, place, and time.     Coordination: Coordination normal.  Psychiatric:        Behavior: Behavior normal.       Assessment & Plan:   Problem List Items Addressed This Visit       Endocrine   Type 2 diabetes mellitus with stage 3a chronic kidney disease, without long-term current use of insulin ([place]) - Primary   Relevant Orders   CBC with Differential/Platelet (Completed)   CMP14+EGFR (Completed)   Lipid panel (Completed)   Bayer DCA Hb A1c Waived (Completed)   Hyperlipidemia associated with type 2 diabetes mellitus ([place])   Relevant Orders   CBC with Differential/Platelet (Completed)   CMP14+EGFR (Completed)   Lipid panel (Completed)   Bayer DCA Hb A1c Waived (Completed)     Genitourinary   Stage 3a chronic kidney disease (CKD) ([place])   Other Visit Diagnoses     Need for shingles vaccine       Relevant Orders   Zoster Recombinant (Shingrix ) (Completed)       A1c up just slightly at 6.8.  No change in medication, continue to focus on diet.  Activity as well. Follow up plan: Return in about 3 months (around 07/29/2022), or if symptoms worsen or fail to improve, for Diabetes and hyperlipidemia and CKD.  Counseling provided for all of the vaccine components Orders Placed This Encounter  Procedures   Zoster Recombinant (Shingrix )   CBC with Differential/Platelet    CMP14+EGFR   Lipid panel   Bayer DCA Hb A1c Waived    Caryl Pina, MD [facility] 05/23/2022, 1:19 PM

## 2022-05-25 ENCOUNTER — Other Ambulatory Visit (HOSPITAL_COMMUNITY): Payer: Self-pay

## 2022-05-31 DIAGNOSIS — H3540 Unspecified peripheral retinal degeneration: Secondary | ICD-10-CM | POA: Diagnosis not present

## 2022-05-31 LAB — HM DIABETES EYE EXAM

## 2022-06-07 DIAGNOSIS — K080 Exfoliation of teeth due to systemic causes: Secondary | ICD-10-CM | POA: Diagnosis not present

## 2022-06-27 ENCOUNTER — Other Ambulatory Visit (HOSPITAL_COMMUNITY): Payer: Self-pay

## 2022-06-28 ENCOUNTER — Other Ambulatory Visit (HOSPITAL_COMMUNITY): Payer: Self-pay

## 2022-06-28 ENCOUNTER — Other Ambulatory Visit: Payer: Self-pay | Admitting: Family Medicine

## 2022-06-28 ENCOUNTER — Other Ambulatory Visit: Payer: Self-pay

## 2022-06-28 DIAGNOSIS — N1831 Chronic kidney disease, stage 3a: Secondary | ICD-10-CM

## 2022-06-28 MED ORDER — SITAGLIPTIN PHOSPHATE 100 MG PO TABS
100.0000 mg | ORAL_TABLET | Freq: Every day | ORAL | 0 refills | Status: DC
  Filled 2022-06-28: qty 30, 30d supply, fill #0

## 2022-07-10 ENCOUNTER — Other Ambulatory Visit (HOSPITAL_COMMUNITY): Payer: Self-pay

## 2022-07-10 ENCOUNTER — Other Ambulatory Visit: Payer: Self-pay

## 2022-07-30 ENCOUNTER — Other Ambulatory Visit: Payer: Self-pay

## 2022-07-30 ENCOUNTER — Other Ambulatory Visit (HOSPITAL_COMMUNITY): Payer: Self-pay

## 2022-07-30 ENCOUNTER — Encounter: Payer: Self-pay | Admitting: Pharmacist

## 2022-07-30 ENCOUNTER — Encounter: Payer: Self-pay | Admitting: Family Medicine

## 2022-07-30 ENCOUNTER — Ambulatory Visit (INDEPENDENT_AMBULATORY_CARE_PROVIDER_SITE_OTHER): Payer: Medicare Other | Admitting: Family Medicine

## 2022-07-30 VITALS — BP 119/84 | HR 72 | Ht 71.0 in | Wt 222.0 lb

## 2022-07-30 DIAGNOSIS — E785 Hyperlipidemia, unspecified: Secondary | ICD-10-CM | POA: Diagnosis not present

## 2022-07-30 DIAGNOSIS — Z23 Encounter for immunization: Secondary | ICD-10-CM

## 2022-07-30 DIAGNOSIS — E1122 Type 2 diabetes mellitus with diabetic chronic kidney disease: Secondary | ICD-10-CM | POA: Diagnosis not present

## 2022-07-30 DIAGNOSIS — E1169 Type 2 diabetes mellitus with other specified complication: Secondary | ICD-10-CM | POA: Diagnosis not present

## 2022-07-30 DIAGNOSIS — N529 Male erectile dysfunction, unspecified: Secondary | ICD-10-CM | POA: Diagnosis not present

## 2022-07-30 DIAGNOSIS — Z7984 Long term (current) use of oral hypoglycemic drugs: Secondary | ICD-10-CM

## 2022-07-30 DIAGNOSIS — N1831 Chronic kidney disease, stage 3a: Secondary | ICD-10-CM

## 2022-07-30 LAB — LIPID PANEL

## 2022-07-30 LAB — BAYER DCA HB A1C WAIVED: HB A1C (BAYER DCA - WAIVED): 6.3 % — ABNORMAL HIGH (ref 4.8–5.6)

## 2022-07-30 MED ORDER — METFORMIN HCL 500 MG PO TABS
500.0000 mg | ORAL_TABLET | Freq: Two times a day (BID) | ORAL | 3 refills | Status: DC
Start: 2022-07-30 — End: 2023-07-17
  Filled 2022-07-30: qty 180, 90d supply, fill #0
  Filled 2023-04-11 (×2): qty 180, 90d supply, fill #1

## 2022-07-30 MED ORDER — SILDENAFIL CITRATE 100 MG PO TABS
50.0000 mg | ORAL_TABLET | Freq: Every day | ORAL | 3 refills | Status: DC | PRN
Start: 2022-07-30 — End: 2023-07-17
  Filled 2022-07-30: qty 20, 30d supply, fill #0
  Filled 2023-02-06: qty 20, 30d supply, fill #1
  Filled 2023-04-29: qty 20, 20d supply, fill #2
  Filled 2023-06-09: qty 20, 20d supply, fill #3

## 2022-07-30 MED ORDER — SITAGLIPTIN PHOSPHATE 100 MG PO TABS
100.0000 mg | ORAL_TABLET | Freq: Every day | ORAL | 3 refills | Status: DC
Start: 2022-07-30 — End: 2023-07-17
  Filled 2022-07-30: qty 90, 90d supply, fill #0
  Filled 2022-07-30 – 2022-08-06 (×2): qty 30, 30d supply, fill #0
  Filled 2022-08-31 – 2022-09-26 (×2): qty 30, 30d supply, fill #1
  Filled 2022-10-24: qty 30, 30d supply, fill #2
  Filled 2022-11-26: qty 30, 30d supply, fill #3
  Filled 2022-12-30: qty 30, 30d supply, fill #4
  Filled 2023-02-06: qty 30, 30d supply, fill #5
  Filled 2023-03-18: qty 30, 30d supply, fill #6
  Filled 2023-05-11 – 2023-05-13 (×3): qty 30, 30d supply, fill #7
  Filled 2023-06-09: qty 30, 30d supply, fill #8
  Filled 2023-07-07: qty 30, 30d supply, fill #9

## 2022-07-30 MED ORDER — ATORVASTATIN CALCIUM 20 MG PO TABS
20.0000 mg | ORAL_TABLET | Freq: Every day | ORAL | 3 refills | Status: DC
Start: 2022-07-30 — End: 2023-07-17
  Filled 2022-07-30: qty 90, 90d supply, fill #0
  Filled 2022-11-14: qty 90, 90d supply, fill #1
  Filled 2023-03-18: qty 90, 90d supply, fill #2
  Filled 2023-06-09: qty 90, 90d supply, fill #3

## 2022-07-30 NOTE — Progress Notes (Signed)
BP 119/84   Pulse 72   Ht 5\' 11"  (1.803 m)   Wt 222 lb (100.7 kg)   SpO2 95%   BMI 30.96 kg/m    Subjective:   Patient ID: Matthew Bolton, male    DOB: 1955/05/30, 67 y.o.   MRN: [id number]  HPI: Matthew Bolton is a 67 y.o. male presenting on 07/30/2022 for Medical Management of Chronic Issues, Diabetes, and Chronic Kidney Disease   HPI Type 2 diabetes mellitus Patient comes in today for recheck of his diabetes. Patient has been currently taking Jardiance and metformin and Januvia. Patient is not currently on an ACE inhibitor/ARB due to low blood pressure. Patient has not seen an ophthalmologist this year. Patient denies any new issues with their feet. The symptom started onset as an adult CKD and hyperlipidemia ARE RELATED TO DM   CKD recheck Patient is currently on Jardiance, and their blood pressure today is 119/84. Patient denies any lightheadedness or dizziness. Patient denies headaches, blurred vision, chest pains, shortness of breath, or weakness. Denies any side effects from medication and is content with current medication.   Hyperlipidemia Patient is coming in for recheck of his hyperlipidemia. The patient is currently taking atorvastatin. They deny any issues with myalgias or history of liver damage from it. They deny any focal numbness or weakness or chest pain.   Relevant past medical, surgical, family and social history reviewed and updated as indicated. Interim medical history since our last visit reviewed. Allergies and medications reviewed and updated.  Review of Systems  Constitutional:  Negative for chills and fever.  Eyes:  Negative for visual disturbance.  Respiratory:  Negative for shortness of breath and wheezing.   Cardiovascular:  Negative for chest pain and leg swelling.  Musculoskeletal:  Negative for back pain and gait problem.  Skin:  Negative for rash.  All other systems reviewed and are negative.   Per HPI unless specifically indicated  above   Allergies as of 07/30/2022   No Known Allergies      Medication List        Accurate as of Jul 30, 2022  2:12 PM. If you have any questions, ask your nurse or doctor.          acetaminophen 325 MG tablet Commonly known as: TYLENOL Take 650 mg by mouth every 6 (six) hours as needed.   atorvastatin 20 MG tablet Commonly known as: LIPITOR Take 1 tablet (20 mg total) by mouth daily.   cholecalciferol 25 MCG (1000 UNIT) tablet Commonly known as: VITAMIN D3 Take 1,000 Units by mouth daily.   colchicine 0.6 MG tablet Day 1: Take 1.2 mg at the first sign of flare, followed in 1 hour with a single dose of 0.6 mg. Then 1 tablet twice daily until gout resolved.   Jardiance 25 MG Tabs tablet Generic drug: empagliflozin Take 1 tablet (25 mg total) by mouth daily before breakfast.   metFORMIN 500 MG tablet Commonly known as: GLUCOPHAGE Take 1 tablet (500 mg total) by mouth 2 (two) times daily with a meal.   sildenafil 100 MG tablet Commonly known as: Viagra Take 0.5-1 tablets (50-100 mg total) by mouth daily as needed for erectile dysfunction.   sitaGLIPtin 100 MG tablet Commonly known as: JANUVIA Take 1 tablet (100 mg total) by mouth daily.   zinc gluconate 50 MG tablet Take 50 mg by mouth daily.         Objective:   BP 119/84   Pulse 72  Ht 5\' 11"  (1.803 m)   Wt 222 lb (100.7 kg)   SpO2 95%   BMI 30.96 kg/m   Wt Readings from Last 3 Encounters:  07/30/22 222 lb (100.7 kg)  04/30/22 224 lb (101.6 kg)  04/12/22 224 lb (101.6 kg)    Physical Exam Vitals and nursing note reviewed.  Constitutional:      General: He is not in acute distress.    Appearance: He is well-developed. He is not diaphoretic.  Eyes:     General: No scleral icterus.    Conjunctiva/sclera: Conjunctivae normal.  Neck:     Thyroid: No thyromegaly.  Cardiovascular:     Rate and Rhythm: Normal rate and regular rhythm.     Heart sounds: Normal heart sounds. No murmur  heard. Pulmonary:     Effort: Pulmonary effort is normal. No respiratory distress.     Breath sounds: Normal breath sounds. No wheezing.  Musculoskeletal:     Cervical back: Neck supple.  Lymphadenopathy:     Cervical: No cervical adenopathy.  Skin:    General: Skin is warm and dry.     Findings: No rash.  Neurological:     Mental Status: He is alert and oriented to person, place, and time.     Coordination: Coordination normal.  Psychiatric:        Behavior: Behavior normal.     Results for orders placed or performed in visit on 07/10/22  HM DIABETES EYE EXAM  Result Value Ref Range   HM Diabetic Eye Exam No Retinopathy No Retinopathy    Assessment & Plan:   Problem List Items Addressed This Visit       Endocrine   Type 2 diabetes mellitus with stage 3a chronic kidney disease, without long-term current use of insulin ([facility]) - Primary   Relevant Medications   atorvastatin (LIPITOR) 20 MG tablet   metFORMIN (GLUCOPHAGE) 500 MG tablet   sitaGLIPtin (JANUVIA) 100 MG tablet   Other Relevant Orders   CBC with Differential/Platelet   CMP14+EGFR   Lipid panel   Bayer DCA Hb A1c Waived   Hyperlipidemia associated with type 2 diabetes mellitus ([facility])   Relevant Medications   atorvastatin (LIPITOR) 20 MG tablet   metFORMIN (GLUCOPHAGE) 500 MG tablet   sildenafil (VIAGRA) 100 MG tablet   sitaGLIPtin (JANUVIA) 100 MG tablet   Other Relevant Orders   CBC with Differential/Platelet   CMP14+EGFR   Lipid panel   Bayer DCA Hb A1c Waived     Genitourinary   Stage 3a chronic kidney disease (CKD) ([facility])   Relevant Orders   CBC with Differential/Platelet   CMP14+EGFR   Lipid panel   Bayer DCA Hb A1c Waived   Other Visit Diagnoses     Erectile dysfunction, unspecified erectile dysfunction type       Relevant Medications   sildenafil (VIAGRA) 100 MG tablet   Need for shingles vaccine       Relevant Orders   Zoster Recombinant (Shingrix )     A1c is 6.3, looks better.   Continue current medicine  Patient had some concerns about psoriatic arthritis and discussed that the possibility is to go to a rheumatologist or dermatologist for treatment for his psoriasis and he is going to consider and weigh the risks of those treatments.  Follow up plan: Return in about 3 months (around 10/30/2022), or if symptoms worsen or fail to improve, for Diabetes and CKD recheck.  Counseling provided for all of the vaccine components Orders Placed This  Encounter  Procedures   Zoster Recombinant (Shingrix )   CBC with Differential/Platelet   CMP14+EGFR   Lipid panel   Bayer DCA Hb A1c Waived    Arville Care, MD [facility] 07/30/2022, 2:12 PM

## 2022-07-31 LAB — CMP14+EGFR
ALT: 22 IU/L (ref 0–44)
AST: 19 IU/L (ref 0–40)
Albumin/Globulin Ratio: 1.7 (ref 1.2–2.2)
Albumin: 4.3 g/dL (ref 3.9–4.9)
Alkaline Phosphatase: 88 IU/L (ref 44–121)
BUN/Creatinine Ratio: 14 (ref 10–24)
BUN: 21 mg/dL (ref 8–27)
Bilirubin Total: 0.5 mg/dL (ref 0.0–1.2)
CO2: 18 mmol/L — ABNORMAL LOW (ref 20–29)
Calcium: 9.6 mg/dL (ref 8.6–10.2)
Chloride: 106 mmol/L (ref 96–106)
Creatinine, Ser: 1.46 mg/dL — ABNORMAL HIGH (ref 0.76–1.27)
Globulin, Total: 2.5 g/dL (ref 1.5–4.5)
Glucose: 190 mg/dL — ABNORMAL HIGH (ref 70–99)
Potassium: 5 mmol/L (ref 3.5–5.2)
Sodium: 142 mmol/L (ref 134–144)
Total Protein: 6.8 g/dL (ref 6.0–8.5)
eGFR: 53 mL/min/{1.73_m2} — ABNORMAL LOW (ref >59)

## 2022-07-31 LAB — CBC WITH DIFFERENTIAL/PLATELET
Basophils Absolute: 0.1 10*3/uL (ref 0.0–0.2)
Basos: 1 %
EOS (ABSOLUTE): 0.2 10*3/uL (ref 0.0–0.4)
Eos: 3 %
Hematocrit: 51.1 % — ABNORMAL HIGH (ref 37.5–51.0)
Hemoglobin: 17.2 g/dL (ref 13.0–17.7)
Immature Grans (Abs): 0 10*3/uL (ref 0.0–0.1)
Immature Granulocytes: 0 %
Lymphocytes Absolute: 1.2 10*3/uL (ref 0.7–3.1)
Lymphs: 22 %
MCH: 30.2 pg (ref 26.6–33.0)
MCHC: 33.7 g/dL (ref 31.5–35.7)
MCV: 90 fL (ref 79–97)
Monocytes Absolute: 0.4 10*3/uL (ref 0.1–0.9)
Monocytes: 7 %
Neutrophils Absolute: 3.6 10*3/uL (ref 1.4–7.0)
Neutrophils: 67 %
Platelets: 132 10*3/uL — ABNORMAL LOW (ref 150–450)
RBC: 5.69 x10E6/uL (ref 4.14–5.80)
RDW: 13.3 % (ref 11.6–15.4)
WBC: 5.4 10*3/uL (ref 3.4–10.8)

## 2022-07-31 LAB — LIPID PANEL
Chol/HDL Ratio: 4 ratio (ref 0.0–5.0)
HDL: 36 mg/dL — ABNORMAL LOW (ref >39)
LDL Chol Calc (NIH): 80 mg/dL (ref 0–99)
Triglycerides: 166 mg/dL — ABNORMAL HIGH (ref 0–149)
VLDL Cholesterol Cal: 29 mg/dL (ref 5–40)

## 2022-08-02 ENCOUNTER — Other Ambulatory Visit: Payer: Self-pay

## 2022-08-06 ENCOUNTER — Other Ambulatory Visit: Payer: Self-pay

## 2022-08-06 ENCOUNTER — Other Ambulatory Visit (HOSPITAL_COMMUNITY): Payer: Self-pay

## 2022-08-21 ENCOUNTER — Other Ambulatory Visit: Payer: Self-pay

## 2022-08-21 ENCOUNTER — Ambulatory Visit (INDEPENDENT_AMBULATORY_CARE_PROVIDER_SITE_OTHER): Payer: Medicare Other

## 2022-08-21 ENCOUNTER — Other Ambulatory Visit (HOSPITAL_COMMUNITY): Payer: Self-pay

## 2022-08-21 ENCOUNTER — Ambulatory Visit (INDEPENDENT_AMBULATORY_CARE_PROVIDER_SITE_OTHER): Payer: Medicare Other | Admitting: Family Medicine

## 2022-08-21 ENCOUNTER — Encounter: Payer: Self-pay | Admitting: Pharmacist

## 2022-08-21 ENCOUNTER — Encounter: Payer: Self-pay | Admitting: Family Medicine

## 2022-08-21 VITALS — BP 119/82 | HR 71 | Ht 71.0 in | Wt 219.0 lb

## 2022-08-21 DIAGNOSIS — M1612 Unilateral primary osteoarthritis, left hip: Secondary | ICD-10-CM | POA: Diagnosis not present

## 2022-08-21 DIAGNOSIS — M25552 Pain in left hip: Secondary | ICD-10-CM | POA: Diagnosis not present

## 2022-08-21 MED ORDER — PREDNISONE 20 MG PO TABS
40.0000 mg | ORAL_TABLET | Freq: Every day | ORAL | 0 refills | Status: AC
Start: 2022-08-21 — End: 2022-08-26
  Filled 2022-08-21: qty 10, 5d supply, fill #0

## 2022-08-21 NOTE — Progress Notes (Signed)
Acute Office Visit  Subjective:  Patient ID: Matthew Bolton, male    DOB: 12/21/1955, 67 y.o.   MRN: [id number]  Chief Complaint  Patient presents with   Hip Pain    Left. Started one week ago after gardening. Does not radiate down leg. C/O dull ache    Hip Pain    Patient is in today for hip pain.  States that it started one week ago and he was planting/digging holes on tree roots. Started a few days later. States that it feel like it is "on the bone" States that it is worse, just "stopped him"  Denies radiation, numbness, tingling. States that once in a while he can feel it across his back because he is tensing up. He has tried heating pad, tylenol, which are not really helping. Rest and sleep makes it better. Movement makes it worse. Rates it as a 0/10 sitting down. 5/10 while walking.   ROS As per HPI Objective:  BP 119/82   Pulse 71   Ht 5\' 11"  (1.803 m)   Wt 219 lb (99.3 kg)   SpO2 95%   BMI 30.54 kg/m   Physical Exam Constitutional:      General: He is awake. He is not in acute distress.    Appearance: Normal appearance. He is well-developed and well-groomed. He is not ill-appearing, toxic-appearing or diaphoretic.  Cardiovascular:     Rate and Rhythm: Normal rate.     Pulses: Normal pulses.          Radial pulses are 2+ on the right side and 2+ on the left side.       Posterior tibial pulses are 2+ on the right side and 2+ on the left side.     Heart sounds: Normal heart sounds. No murmur heard.    No gallop.  Pulmonary:     Effort: Pulmonary effort is normal. No respiratory distress.     Breath sounds: Normal breath sounds. No stridor. No wheezing, rhonchi or rales.  Musculoskeletal:     Cervical back: Full passive range of motion without pain and neck supple.     Lumbar back: No swelling, edema, deformity, signs of trauma, lacerations, spasms, tenderness or bony tenderness. Normal range of motion. Positive right straight leg raise test. Negative left  straight leg raise test. No scoliosis.     Left hip: No deformity, lacerations, tenderness, bony tenderness or crepitus. Normal range of motion. Normal strength.     Right lower leg: No edema.     Left lower leg: No edema.  Skin:    General: Skin is warm.     Capillary Refill: Capillary refill takes less than 2 seconds.  Neurological:     General: No focal deficit present.     Mental Status: He is alert, oriented to person, place, and time and easily aroused. Mental status is at baseline.     GCS: GCS eye subscore is 4. GCS verbal subscore is 5. GCS motor subscore is 6.     Motor: No weakness.  Psychiatric:        Attention and Perception: Attention and perception normal.        Mood and Affect: Mood and affect normal.        Speech: Speech normal.        Behavior: Behavior normal. Behavior is cooperative.        Thought Content: Thought content normal. Thought content does not include homicidal or suicidal ideation. Thought content does not include  homicidal or suicidal plan.        Cognition and Memory: Cognition and memory normal.        Judgment: Judgment normal.    Assessment & Plan:  1. Pain in joint of left hip Will start conservative treatment as below. Discussed red flag symptoms and symptoms of septic arthritis. A1C well controlled with last labs. Patient also has CKD, therefore chose prednisone over NSAIDs. Will obtain imaging as below. If symptoms persist, instructed patient to follow up.  - predniSONE (DELTASONE) 20 MG tablet; Take 2 tablets (40 mg total) by mouth daily with breakfast for 5 days.  Dispense: 10 tablet; Refill: 0 - DG HIP UNILAT W OR W/O PELVIS 2-3 VIEWS LEFT  The above assessment and management plan was discussed with the patient. The patient verbalized understanding of and has agreed to the management plan using shared-decision making. Patient is aware to call the clinic if they develop any new symptoms or if symptoms fail to improve or worsen. Patient is  aware when to return to the clinic for a follow-up visit. Patient educated on when it is appropriate to go to the emergency department.   Return if symptoms worsen or fail to improve.  Neale Burly, DNP-FNP [facility] [address] [place], [place] [address] [phone]

## 2022-08-23 ENCOUNTER — Encounter: Payer: Self-pay | Admitting: Family Medicine

## 2022-08-25 ENCOUNTER — Encounter: Payer: Self-pay | Admitting: Family Medicine

## 2022-08-25 DIAGNOSIS — M25552 Pain in left hip: Secondary | ICD-10-CM

## 2022-08-27 ENCOUNTER — Telehealth: Payer: Self-pay | Admitting: Family Medicine

## 2022-08-27 NOTE — Telephone Encounter (Signed)
Contacted patient. Notified patient. Patient verbalized understanding 

## 2022-08-27 NOTE — Telephone Encounter (Signed)
I placed a referral to physical therapy

## 2022-08-27 NOTE — Telephone Encounter (Signed)
  Incoming Patient Call  08/27/2022  What symptoms do you have? Left hip pain, no changes   How long have you been sick? About two weeks   Have you been seen for this problem? Yes, 08/21/22  If your provider decides to give you a prescription, which pharmacy would you like for it to be sent to?  [facility]    Patient informed that this information will be sent to the clinical staff for review and that they should receive a follow up call.

## 2022-08-29 NOTE — Addendum Note (Signed)
Addended by: Neale Burly on: 08/29/2022 12:52 PM   Modules accepted: Orders

## 2022-08-29 NOTE — Progress Notes (Signed)
Degenerative changes of left hip, which indicates decreased joint space and decreased cartilage. Due to the calcification, please schedule patient for additional imaging of femur. Orders placed xrays.

## 2022-08-30 ENCOUNTER — Ambulatory Visit (INDEPENDENT_AMBULATORY_CARE_PROVIDER_SITE_OTHER): Payer: Medicare Other

## 2022-08-30 DIAGNOSIS — M25552 Pain in left hip: Secondary | ICD-10-CM

## 2022-08-30 DIAGNOSIS — M7989 Other specified soft tissue disorders: Secondary | ICD-10-CM | POA: Diagnosis not present

## 2022-08-31 ENCOUNTER — Other Ambulatory Visit (HOSPITAL_COMMUNITY): Payer: Self-pay

## 2022-09-07 ENCOUNTER — Ambulatory Visit: Payer: Medicare Other | Admitting: Physical Therapy

## 2022-09-07 NOTE — Progress Notes (Signed)
Xray consistent with myositis ossificans, which is a lesion of the bone that can happen after injury to the quadriceps (thigh) muscle. This lesion is typically reabsorbed into the bone but can last over 12 months. Because there was not a true "injury" based on the story, want to ensure that this is the correct diagnosis and complete an MRI of the thigh. Order placed.

## 2022-09-07 NOTE — Therapy (Signed)
The patient presented to the clinic with left hip feeling 80-90% better.  He has been swimming and walking which has helped.  Plan to wait on beginning PT at this time.  If patient feels he needs PT he will call the clinic to schedule.

## 2022-09-10 DIAGNOSIS — K080 Exfoliation of teeth due to systemic causes: Secondary | ICD-10-CM | POA: Diagnosis not present

## 2022-09-11 ENCOUNTER — Encounter: Payer: Self-pay | Admitting: Pharmacist

## 2022-09-11 ENCOUNTER — Other Ambulatory Visit: Payer: Self-pay

## 2022-09-11 MED ORDER — AMOXICILLIN 500 MG PO CAPS
500.0000 mg | ORAL_CAPSULE | Freq: Three times a day (TID) | ORAL | 0 refills | Status: DC
  Filled 2022-09-11: qty 30, 10d supply, fill #0

## 2022-09-25 ENCOUNTER — Ambulatory Visit (HOSPITAL_COMMUNITY)
Admission: RE | Admit: 2022-09-25 | Discharge: 2022-09-25 | Disposition: A | Discharge status: Home / Self Care | Payer: Medicare Other | Source: Ambulatory Visit | Attending: Family Medicine | Admitting: Family Medicine

## 2022-09-25 DIAGNOSIS — M25552 Pain in left hip: Secondary | ICD-10-CM | POA: Insufficient documentation

## 2022-09-25 DIAGNOSIS — M79605 Pain in left leg: Secondary | ICD-10-CM | POA: Diagnosis not present

## 2022-09-25 MED ORDER — GADOBUTROL 1 MMOL/ML IV SOLN
10.0000 mL | Freq: Once | INTRAVENOUS | Status: AC | PRN
  Administered 2022-09-25: 10 mL via INTRAVENOUS

## 2022-09-26 ENCOUNTER — Other Ambulatory Visit (HOSPITAL_COMMUNITY): Payer: Self-pay

## 2022-09-26 ENCOUNTER — Other Ambulatory Visit: Payer: Self-pay

## 2022-10-04 ENCOUNTER — Other Ambulatory Visit (HOSPITAL_COMMUNITY): Payer: Self-pay | Admitting: Family Medicine

## 2022-10-04 DIAGNOSIS — M6150 Other ossification of muscle, unspecified site: Secondary | ICD-10-CM

## 2022-10-04 NOTE — Progress Notes (Signed)
Would like to refer patient to ortho for evaluation and management of myositis ossificans

## 2022-10-09 DIAGNOSIS — K080 Exfoliation of teeth due to systemic causes: Secondary | ICD-10-CM | POA: Diagnosis not present

## 2022-10-24 ENCOUNTER — Other Ambulatory Visit: Payer: Self-pay

## 2022-10-24 ENCOUNTER — Other Ambulatory Visit (HOSPITAL_COMMUNITY): Payer: Self-pay

## 2022-10-25 ENCOUNTER — Other Ambulatory Visit (HOSPITAL_COMMUNITY): Payer: Self-pay

## 2022-11-14 ENCOUNTER — Other Ambulatory Visit (HOSPITAL_COMMUNITY): Payer: Self-pay

## 2022-11-14 ENCOUNTER — Other Ambulatory Visit: Payer: Self-pay

## 2022-11-14 ENCOUNTER — Other Ambulatory Visit: Payer: Self-pay | Admitting: Family Medicine

## 2022-11-14 DIAGNOSIS — E1122 Type 2 diabetes mellitus with diabetic chronic kidney disease: Secondary | ICD-10-CM

## 2022-11-14 MED ORDER — EMPAGLIFLOZIN 25 MG PO TABS
25.0000 mg | ORAL_TABLET | Freq: Every day | ORAL | 0 refills | Status: DC
Start: 2022-11-14 — End: 2022-11-22
  Filled 2022-11-14: qty 30, 30d supply, fill #0

## 2022-11-15 ENCOUNTER — Other Ambulatory Visit: Payer: Self-pay

## 2022-11-15 ENCOUNTER — Other Ambulatory Visit (HOSPITAL_COMMUNITY): Payer: Self-pay

## 2022-11-22 ENCOUNTER — Encounter: Payer: Self-pay | Admitting: Family Medicine

## 2022-11-22 ENCOUNTER — Encounter: Payer: Self-pay | Admitting: Pharmacist

## 2022-11-22 ENCOUNTER — Ambulatory Visit (INDEPENDENT_AMBULATORY_CARE_PROVIDER_SITE_OTHER): Payer: Medicare Other | Admitting: Family Medicine

## 2022-11-22 ENCOUNTER — Other Ambulatory Visit: Payer: Self-pay | Admitting: Family Medicine

## 2022-11-22 ENCOUNTER — Other Ambulatory Visit (HOSPITAL_COMMUNITY): Payer: Self-pay

## 2022-11-22 VITALS — BP 112/76 | HR 75 | Ht 71.0 in | Wt 214.0 lb

## 2022-11-22 DIAGNOSIS — N1831 Chronic kidney disease, stage 3a: Secondary | ICD-10-CM | POA: Diagnosis not present

## 2022-11-22 DIAGNOSIS — E1122 Type 2 diabetes mellitus with diabetic chronic kidney disease: Secondary | ICD-10-CM

## 2022-11-22 DIAGNOSIS — Z0001 Encounter for general adult medical examination with abnormal findings: Secondary | ICD-10-CM

## 2022-11-22 DIAGNOSIS — Z9189 Other specified personal risk factors, not elsewhere classified: Secondary | ICD-10-CM | POA: Diagnosis not present

## 2022-11-22 DIAGNOSIS — Z0000 Encounter for general adult medical examination without abnormal findings: Secondary | ICD-10-CM

## 2022-11-22 DIAGNOSIS — Z7984 Long term (current) use of oral hypoglycemic drugs: Secondary | ICD-10-CM

## 2022-11-22 LAB — CBC WITH DIFFERENTIAL/PLATELET
Basophils Absolute: 0.1 x10E3/uL (ref 0.0–0.2)
Basos: 1 %
EOS (ABSOLUTE): 0.2 x10E3/uL (ref 0.0–0.4)
Eos: 3 %
Hematocrit: 47.6 % (ref 37.5–51.0)
Hemoglobin: 15.8 g/dL (ref 13.0–17.7)
Immature Grans (Abs): 0 x10E3/uL (ref 0.0–0.1)
Immature Granulocytes: 0 %
Lymphocytes Absolute: 1.6 x10E3/uL (ref 0.7–3.1)
Lymphs: 34 %
MCH: 30.3 pg (ref 26.6–33.0)
MCHC: 33.2 g/dL (ref 31.5–35.7)
MCV: 91 fL (ref 79–97)
Monocytes Absolute: 0.3 x10E3/uL (ref 0.1–0.9)
Monocytes: 6 %
Neutrophils Absolute: 2.7 x10E3/uL (ref 1.4–7.0)
Neutrophils: 56 %
Platelets: 114 x10E3/uL — ABNORMAL LOW (ref 150–450)
RBC: 5.22 x10E6/uL (ref 4.14–5.80)
RDW: 13.5 % (ref 11.6–15.4)
WBC: 4.8 x10E3/uL (ref 3.4–10.8)

## 2022-11-22 LAB — CMP14+EGFR
ALT: 16 IU/L (ref 0–44)
AST: 20 IU/L (ref 0–40)
Albumin: 4.1 g/dL (ref 3.9–4.9)
Alkaline Phosphatase: 76 IU/L (ref 44–121)
BUN/Creatinine Ratio: 15 (ref 10–24)
BUN: 21 mg/dL (ref 8–27)
Bilirubin Total: 0.4 mg/dL (ref 0.0–1.2)
CO2: 24 mmol/L (ref 20–29)
Calcium: 9.6 mg/dL (ref 8.6–10.2)
Chloride: 102 mmol/L (ref 96–106)
Creatinine, Ser: 1.42 mg/dL — ABNORMAL HIGH (ref 0.76–1.27)
Globulin, Total: 2.6 g/dL (ref 1.5–4.5)
Glucose: 111 mg/dL — ABNORMAL HIGH (ref 70–99)
Potassium: 4.8 mmol/L (ref 3.5–5.2)
Sodium: 140 mmol/L (ref 134–144)
Total Protein: 6.7 g/dL (ref 6.0–8.5)
eGFR: 55 mL/min/1.73 — ABNORMAL LOW

## 2022-11-22 LAB — LIPID PANEL
Chol/HDL Ratio: 3.4 ratio (ref 0.0–5.0)
Cholesterol, Total: 128 mg/dL (ref 100–199)
HDL: 38 mg/dL — ABNORMAL LOW
LDL Chol Calc (NIH): 62 mg/dL (ref 0–99)
Triglycerides: 168 mg/dL — ABNORMAL HIGH (ref 0–149)
VLDL Cholesterol Cal: 28 mg/dL (ref 5–40)

## 2022-11-22 LAB — BAYER DCA HB A1C WAIVED: HB A1C (BAYER DCA - WAIVED): 5.6 % (ref 4.8–5.6)

## 2022-11-22 MED ORDER — EMPAGLIFLOZIN 25 MG PO TABS
25.0000 mg | ORAL_TABLET | Freq: Every day | ORAL | 3 refills | Status: DC
Start: 2022-11-22 — End: 2023-07-17
  Filled 2022-11-22: qty 90, 90d supply, fill #0
  Filled 2022-12-19: qty 30, 30d supply, fill #0
  Filled 2023-01-19: qty 30, 30d supply, fill #1
  Filled 2023-03-05: qty 30, 30d supply, fill #2
  Filled 2023-04-11 – 2023-04-29 (×2): qty 30, 30d supply, fill #3
  Filled 2023-05-31: qty 30, 30d supply, fill #4
  Filled 2023-07-07: qty 30, 30d supply, fill #5

## 2022-11-22 NOTE — Progress Notes (Signed)
Subjective:   Matthew Bolton is a 67 y.o. male who presents for a Welcome to Medicare exam.   Review of Systems: Review of Systems  Constitutional:  Negative for chills and fever.  HENT:  Negative for ear pain and tinnitus.   Eyes:  Negative for blurred vision and pain.  Respiratory:  Negative for cough, shortness of breath and wheezing.   Cardiovascular:  Negative for chest pain, palpitations and leg swelling.  Gastrointestinal:  Negative for abdominal pain, blood in stool, constipation, diarrhea and melena.  Genitourinary:  Negative for dysuria and hematuria.  Musculoskeletal:  Negative for back pain, joint pain and myalgias.  Skin:  Negative for rash.  Neurological:  Negative for dizziness, sensory change, focal weakness, weakness and headaches.  Psychiatric/Behavioral:  Negative for depression and suicidal ideas.     Cardiac Risk Factors include: advanced age (>12men, >61 women);diabetes mellitus;male gender  The 10-year ASCVD risk score (Arnett DK, et al., 2019) is: 18.1%   Values used to calculate the score:     Age: 32 years     Sex: Male     Is Non-Hispanic African American: No     Diabetic: Yes     Tobacco smoker: No     Systolic Blood Pressure: 112 mmHg     Is BP treated: No     HDL Cholesterol: 36 mg/dL     Total Cholesterol: 145 mg/dL      Objective:    Today's Vitals   11/22/22 0936  BP: 112/76  Pulse: 75  SpO2: 95%  Weight: 214 lb (97.1 kg)  Height: 5\' 11"  (1.803 m)   Body mass index is 29.85 kg/m.  Medications Outpatient Encounter Medications as of 11/22/2022  Medication Sig   acetaminophen (TYLENOL) 325 MG tablet Take 650 mg by mouth every 6 (six) hours as needed.   atorvastatin (LIPITOR) 20 MG tablet Take 1 tablet (20 mg total) by mouth daily.   cholecalciferol (VITAMIN D3) 25 MCG (1000 UNIT) tablet Take 1,000 Units by mouth daily.   colchicine 0.6 MG tablet Day 1: Take 1.2 mg at the first sign of flare, followed in 1 hour with a single dose  of 0.6 mg. Then 1 tablet twice daily until gout resolved.   metFORMIN (GLUCOPHAGE) 500 MG tablet Take 1 tablet (500 mg total) by mouth 2 (two) times daily with a meal.   sildenafil (VIAGRA) 100 MG tablet Take 0.5-1 tablets (50-100 mg total) by mouth daily as needed for erectile dysfunction.   sitaGLIPtin (JANUVIA) 100 MG tablet Take 1 tablet (100 mg total) by mouth daily.   zinc gluconate 50 MG tablet Take 50 mg by mouth daily.   [DISCONTINUED] empagliflozin (JARDIANCE) 25 MG TABS tablet Take 1 tablet (25 mg total) by mouth daily before breakfast.   empagliflozin (JARDIANCE) 25 MG TABS tablet Take 1 tablet (25 mg total) by mouth daily before breakfast.   [DISCONTINUED] amoxicillin (AMOXIL) 500 MG capsule Take 1 capsule (500 mg total) by mouth 3 (three) times daily. Take with food.   No facility-administered encounter medications on file as of 11/22/2022.     History: History reviewed. No pertinent past medical history. History reviewed. No pertinent surgical history.  Family History  Problem Relation Age of Onset   Hypertension Mother    Cancer Mother    Diabetes Father    Social History   Occupational History   Not on file  Tobacco Use   Smoking status: Former    Current packs/day: 0.00  Average packs/day: 0.3 packs/day for 15.0 years (3.8 ttl pk-yrs)    Types: Cigarettes    Start date: 65    Quit date: 13    Years since quitting: 44.6   Smokeless tobacco: Never  Substance and Sexual Activity   Alcohol use: Yes    Comment: occasionally   Drug use: Never   Sexual activity: Yes    Birth control/protection: Post-menopausal    Tobacco Counseling Counseling given: Not Answered   Immunizations and Health Maintenance Immunization History  Administered Date(s) Administered   Influenza, Seasonal, Injecte, Preservative Fre 01/28/2019   Influenza,inj,Quad PF,6+ Mos 01/27/2021, 01/22/2022   Influenza-Unspecified 01/08/2020   Moderna Sars-Covid-2 Vaccination 06/12/2019,  07/14/2019, 01/08/2020   PNEUMOCOCCAL CONJUGATE-20 04/12/2021   Tdap 11/19/2019   Zoster Recombinant(Shingrix) 04/30/2022, 07/30/2022   Health Maintenance Due  Topic Date Due   COVID-19 Vaccine (4 - 2023-24 season) 11/24/2021   Diabetic kidney evaluation - Urine ACR  10/28/2022   INFLUENZA VACCINE  10/25/2022    Activities of Daily Living    11/22/2022    9:52 AM  In your present state of health, do you have any difficulty performing the following activities:  Hearing? 0  Vision? 0  Difficulty concentrating or making decisions? 1  Comment Memory not as good as it used to be  Walking or climbing stairs? 0  Dressing or bathing? 0  Doing errands, shopping? 0  Preparing Food and eating ? N  Using the Toilet? N  In the past six months, have you accidently leaked urine? N  Do you have problems with loss of bowel control? N  Managing your Medications? N  Managing your Finances? N  Housekeeping or managing your Housekeeping? N    Physical Exam  (optional), or other factors deemed appropriate based on the beneficiary's medical and social history and current clinical standards.  Advanced Directives: Does Patient Have a Medical Advance Directive?: No Would patient like information on creating a medical advance directive?: No - Patient declined    Assessment:    This is a routine wellness  examination for this patient .   Vision/Hearing screen No results found.  Dietary issues and exercise activities discussed:      Goals   None     Depression Screen    11/22/2022    9:39 AM 08/21/2022    8:56 AM 07/30/2022   10:34 AM 04/30/2022   10:50 AM  PHQ 2/9 Scores  PHQ - 2 Score 0 1 3 0  PHQ- 9 Score  2 6 3      Fall Risk    11/22/2022    9:39 AM  Fall Risk   Falls in the past year? 0    Cognitive Function        11/22/2022    9:55 AM  6CIT Screen  What Year? 0 points  What month? 0 points  What time? 0 points  Count back from 20 0 points  Months in reverse 0  points  Repeat phrase 0 points  Total Score 0 points    Patient Care Team: Moria Brophy, Elige Radon, MD as PCP - General (Family Medicine)     Plan:    Problem List Items Addressed This Visit       Endocrine   Type 2 diabetes mellitus with stage 3a chronic kidney disease, without long-term current use of insulin (HCC)   Relevant Medications   empagliflozin (JARDIANCE) 25 MG TABS tablet   Other Relevant Orders   CBC with Differential/Platelet  CMP14+EGFR   Lipid panel   Bayer DCA Hb A1c Waived   Microalbumin / creatinine urine ratio   Other Visit Diagnoses     Encounter for Medicare annual wellness exam    -  Primary   [place] cardiac risk 10-20% in next 10 years       Relevant Orders   Ambulatory referral to Cardiology        I have personally reviewed and noted the following in the patient's chart:   Medical and social history Use of alcohol, tobacco or illicit drugs  Current medications and supplements Functional ability and status Nutritional status Physical activity Advanced directives List of other physicians Hospitalizations, surgeries, and ER visits in previous 12 months Vitals Screenings to include cognitive, depression, and falls Referrals and appointments  In addition, I have reviewed and discussed with patient certain preventive protocols, quality metrics, and best practice recommendations. A written personalized care plan for preventive services as well as general preventive health recommendations were provided to patient.     Nils Pyle, MD 11/22/2022

## 2022-11-23 ENCOUNTER — Other Ambulatory Visit (HOSPITAL_COMMUNITY): Payer: Self-pay

## 2022-11-23 LAB — MICROALBUMIN / CREATININE URINE RATIO
Creatinine, Urine: 62.8 mg/dL
Microalb/Creat Ratio: 5 mg/g{creat} (ref 0–29)
Microalbumin, Urine: 3.4 ug/mL

## 2022-11-26 ENCOUNTER — Other Ambulatory Visit (HOSPITAL_COMMUNITY): Payer: Self-pay

## 2022-11-27 ENCOUNTER — Other Ambulatory Visit: Payer: Self-pay

## 2022-11-27 ENCOUNTER — Other Ambulatory Visit (HOSPITAL_COMMUNITY): Payer: Self-pay

## 2022-11-29 ENCOUNTER — Ambulatory Visit (INDEPENDENT_AMBULATORY_CARE_PROVIDER_SITE_OTHER): Payer: Medicare Other | Admitting: Family Medicine

## 2022-11-29 ENCOUNTER — Encounter: Payer: Self-pay | Admitting: Family Medicine

## 2022-11-29 VITALS — BP 124/81 | HR 77 | Ht 71.0 in | Wt 214.0 lb

## 2022-11-29 DIAGNOSIS — E1122 Type 2 diabetes mellitus with diabetic chronic kidney disease: Secondary | ICD-10-CM

## 2022-11-29 DIAGNOSIS — D696 Thrombocytopenia, unspecified: Secondary | ICD-10-CM

## 2022-11-29 DIAGNOSIS — N1831 Chronic kidney disease, stage 3a: Secondary | ICD-10-CM | POA: Diagnosis not present

## 2022-11-29 DIAGNOSIS — E1169 Type 2 diabetes mellitus with other specified complication: Secondary | ICD-10-CM

## 2022-11-29 DIAGNOSIS — E785 Hyperlipidemia, unspecified: Secondary | ICD-10-CM

## 2022-11-29 DIAGNOSIS — Z7984 Long term (current) use of oral hypoglycemic drugs: Secondary | ICD-10-CM

## 2022-11-29 NOTE — Progress Notes (Signed)
BP 124/81   Pulse 77   Ht 5\' 11"  (1.803 m)   Wt 214 lb (97.1 kg)   SpO2 95%   BMI 29.85 kg/m    Subjective:   Patient ID: Matthew Bolton, male    DOB: [birth year], 67 y.o.   MRN: [id number]  HPI: Matthew Bolton is a 67 y.o. male presenting on 11/29/2022 for Medical Management of Chronic Issues   HPI Type 2 diabetes mellitus Patient comes in today for recheck of his diabetes. Patient has been currently taking metformin and Jardiance and Januvia. Patient is not currently on an ACE inhibitor/ARB. Patient has not seen an ophthalmologist this year. Patient denies any new issues with their feet. The symptom started onset as an adult CKD 3 and hyperlipidemia ARE RELATED TO DM   Hyperlipidemia Patient is coming in for recheck of his hyperlipidemia. The patient is currently taking atorvastatin. They deny any issues with myalgias or history of liver damage from it. They deny any focal numbness or weakness or chest pain.   Stage III CKD recheck Patient is coming in for recheck of kidney disease.  Blood work done on 8/29 just about a week ago.  Renal function looks stable  Relevant past medical, surgical, family and social history reviewed and updated as indicated. Interim medical history since our last visit reviewed. Allergies and medications reviewed and updated.  Review of Systems  Constitutional:  Negative for chills and fever.  Eyes:  Negative for visual disturbance.  Respiratory:  Negative for shortness of breath and wheezing.   Cardiovascular:  Negative for chest pain and leg swelling.  Musculoskeletal:  Negative for back pain and gait problem.  Skin:  Negative for rash.  Neurological:  Negative for dizziness, weakness and light-headedness.  All other systems reviewed and are negative.   Per HPI unless specifically indicated above   Allergies as of 11/29/2022   No Known Allergies      Medication List        Accurate as of November 29, 2022  3:02 PM. If you have any  questions, ask your nurse or doctor.          acetaminophen 325 MG tablet Commonly known as: TYLENOL Take 650 mg by mouth every 6 (six) hours as needed.   atorvastatin 20 MG tablet Commonly known as: LIPITOR Take 1 tablet (20 mg total) by mouth daily.   cholecalciferol 25 MCG (1000 UNIT) tablet Commonly known as: VITAMIN D3 Take 1,000 Units by mouth daily.   colchicine 0.6 MG tablet Day 1: Take 1.2 mg at the first sign of flare, followed in 1 hour with a single dose of 0.6 mg. Then 1 tablet twice daily until gout resolved.   empagliflozin 25 MG Tabs tablet Commonly known as: Jardiance Take 1 tablet (25 mg total) by mouth daily before breakfast.   Januvia 100 MG tablet Generic drug: sitaGLIPtin Take 1 tablet (100 mg total) by mouth daily.   metFORMIN 500 MG tablet Commonly known as: GLUCOPHAGE Take 1 tablet (500 mg total) by mouth 2 (two) times daily with a meal.   sildenafil 100 MG tablet Commonly known as: Viagra Take 0.5-1 tablets (50-100 mg total) by mouth daily as needed for erectile dysfunction.   zinc gluconate 50 MG tablet Take 50 mg by mouth daily.         Objective:   BP 124/81   Pulse 77   Ht 5\' 11"  (1.803 m)   Wt 214 lb (97.1 kg)   SpO2 95%  BMI 29.85 kg/m   Wt Readings from Last 3 Encounters:  11/29/22 214 lb (97.1 kg)  11/22/22 214 lb (97.1 kg)  08/21/22 219 lb (99.3 kg)    Physical Exam Vitals and nursing note reviewed.  Constitutional:      General: He is not in acute distress.    Appearance: He is well-developed. He is not diaphoretic.  Eyes:     General: No scleral icterus.    Conjunctiva/sclera: Conjunctivae normal.  Neck:     Thyroid: No thyromegaly.  Cardiovascular:     Rate and Rhythm: Normal rate and regular rhythm.     Heart sounds: Normal heart sounds. No murmur heard. Pulmonary:     Effort: Pulmonary effort is normal. No respiratory distress.     Breath sounds: Normal breath sounds. No wheezing.  Musculoskeletal:         General: No swelling. Normal range of motion.     Cervical back: Neck supple.  Lymphadenopathy:     Cervical: No cervical adenopathy.  Skin:    General: Skin is warm and dry.     Findings: No rash.  Neurological:     Mental Status: He is alert and oriented to person, place, and time.     Coordination: Coordination normal.  Psychiatric:        Behavior: Behavior normal.       Assessment & Plan:   Problem List Items Addressed This Visit       Endocrine   Type 2 diabetes mellitus with stage 3a chronic kidney disease, without long-term current use of insulin (HCC) - Primary   Relevant Orders   CBC with Differential/Platelet   CMP14+EGFR   Bayer DCA Hb A1c Waived   Hyperlipidemia associated with type 2 diabetes mellitus (HCC)     Genitourinary   Stage 3a chronic kidney disease (CKD) (HCC)   Relevant Orders   CBC with Differential/Platelet   CMP14+EGFR   Bayer DCA Hb A1c Waived   Other Visit Diagnoses     Thrombocytopenia (HCC)       Relevant Orders   CBC with Differential/Platelet   CMP14+EGFR   Bayer DCA Hb A1c Waived       Patient had slightly low platelets on exam but A1c looks good and everything else looks good.  Will have him recheck this with his next visit.  Follow-up in 3 months.  No changes today Follow up plan: Return in about 3 months (around 02/28/2023), or if symptoms worsen or fail to improve, for Diabetes recheck.  Counseling provided for all of the vaccine components Orders Placed This Encounter  Procedures   CBC with Differential/Platelet   CMP14+EGFR   Bayer DCA Hb A1c Waived    Arville Care, MD [facility] 11/29/2022, 3:02 PM

## 2022-12-19 DIAGNOSIS — K080 Exfoliation of teeth due to systemic causes: Secondary | ICD-10-CM | POA: Diagnosis not present

## 2022-12-20 ENCOUNTER — Other Ambulatory Visit (HOSPITAL_COMMUNITY): Payer: Self-pay

## 2022-12-20 ENCOUNTER — Other Ambulatory Visit: Payer: Self-pay

## 2022-12-30 ENCOUNTER — Other Ambulatory Visit (HOSPITAL_COMMUNITY): Payer: Self-pay

## 2022-12-31 ENCOUNTER — Other Ambulatory Visit: Payer: Self-pay

## 2022-12-31 ENCOUNTER — Other Ambulatory Visit (HOSPITAL_COMMUNITY): Payer: Self-pay

## 2023-01-21 ENCOUNTER — Other Ambulatory Visit (HOSPITAL_COMMUNITY): Payer: Self-pay

## 2023-01-24 DIAGNOSIS — K080 Exfoliation of teeth due to systemic causes: Secondary | ICD-10-CM | POA: Diagnosis not present

## 2023-01-28 LAB — LAB REPORT - SCANNED
EGFR: 48
Microalb Creat Ratio: 29.999

## 2023-01-31 ENCOUNTER — Ambulatory Visit: Payer: Medicare Other | Admitting: Family Medicine

## 2023-02-06 ENCOUNTER — Other Ambulatory Visit (HOSPITAL_COMMUNITY): Payer: Self-pay

## 2023-02-06 ENCOUNTER — Other Ambulatory Visit: Payer: Self-pay

## 2023-03-05 ENCOUNTER — Other Ambulatory Visit: Payer: Self-pay

## 2023-03-05 ENCOUNTER — Other Ambulatory Visit (HOSPITAL_COMMUNITY): Payer: Self-pay

## 2023-03-08 ENCOUNTER — Ambulatory Visit: Payer: Medicare Other | Admitting: Family Medicine

## 2023-03-18 ENCOUNTER — Other Ambulatory Visit (HOSPITAL_COMMUNITY): Payer: Self-pay

## 2023-03-18 ENCOUNTER — Other Ambulatory Visit: Payer: Self-pay

## 2023-04-02 DIAGNOSIS — K080 Exfoliation of teeth due to systemic causes: Secondary | ICD-10-CM | POA: Diagnosis not present

## 2023-04-04 ENCOUNTER — Other Ambulatory Visit: Payer: Self-pay

## 2023-04-04 ENCOUNTER — Encounter: Payer: Self-pay | Admitting: Family Medicine

## 2023-04-04 ENCOUNTER — Ambulatory Visit (INDEPENDENT_AMBULATORY_CARE_PROVIDER_SITE_OTHER): Payer: Medicare Other | Admitting: Family Medicine

## 2023-04-04 VITALS — BP 109/71 | HR 77 | Temp 98.0°F | Ht 71.0 in | Wt 216.0 lb

## 2023-04-04 DIAGNOSIS — L02416 Cutaneous abscess of left lower limb: Secondary | ICD-10-CM

## 2023-04-04 MED ORDER — DOXYCYCLINE HYCLATE 100 MG PO TABS
100.0000 mg | ORAL_TABLET | Freq: Two times a day (BID) | ORAL | 0 refills | Status: AC
Start: 2023-04-04 — End: 2023-04-14
  Filled 2023-04-04 – 2023-04-09 (×2): qty 20, 10d supply, fill #0

## 2023-04-04 NOTE — Progress Notes (Signed)
 Subjective:  Patient ID: Matthew Bolton, male    DOB: 1955-06-14, 68 y.o.   MRN: [id number]  Patient Care Team: Dettinger, Fonda LABOR, MD as PCP - General (Family Medicine)   Chief Complaint:  Recurrent Skin Infections (Left upper leg)   HPI: Matthew Bolton is a 68 y.o. male presenting on 04/04/2023 for Recurrent Skin Infections (Left upper leg)   HPI States that it started 8-9 days ago. Denies fever, n/v, fatigue. States that he has had abscesses before and responds well to antibiotics. He is completing warm compresses and trying to drain it.    Relevant past medical, surgical, family, and social history reviewed and updated as indicated.  Allergies and medications reviewed and updated. Data reviewed: Chart in Epic.   History reviewed. No pertinent past medical history.  History reviewed. No pertinent surgical history.  Social History   Socioeconomic History   Marital status: Married    Spouse name: Not on file   Number of children: 2   Years of education: Not on file   Highest education level: Some college, no degree  Occupational History   Not on file  Tobacco Use   Smoking status: Former    Current packs/day: 0.00    Average packs/day: 0.3 packs/day for 15.0 years (3.8 ttl pk-yrs)    Types: Cigarettes    Start date: 1    Quit date: 93    Years since quitting: 45.0   Smokeless tobacco: Never  Substance and Sexual Activity   Alcohol use: Yes    Comment: occasionally   Drug use: Never   Sexual activity: Yes    Birth control/protection: Post-menopausal  Other Topics Concern   Not on file  Social History Narrative   Not on file   Social Drivers of Health   Financial Resource Strain: Low Risk  (11/26/2022)   Overall Financial Resource Strain (CARDIA)    Difficulty of Paying Living Expenses: Not hard at all  Food Insecurity: No Food Insecurity (11/26/2022)   Hunger Vital Sign    Worried About Running Out of Food in the Last Year: Never true    Ran  Out of Food in the Last Year: Never true  Transportation Needs: No Transportation Needs (11/26/2022)   PRAPARE - Administrator, Civil Service (Medical): No    Lack of Transportation (Non-Medical): No  Physical Activity: Insufficiently Active (11/26/2022)   Exercise Vital Sign    Days of Exercise per Week: 2 days    Minutes of Exercise per Session: 20 min  Stress: No Stress Concern Present (11/26/2022)   [employer] of Occupational Health - Occupational Stress Questionnaire    Feeling of Stress : Only a little  Social Connections: Moderately Integrated (11/26/2022)   Social Connection and Isolation Panel [NHANES]    Frequency of Communication with Friends and Family: More than three times a week    Frequency of Social Gatherings with Friends and Family: Once a week    Attends Religious Services: More than 4 times per year    Active Member of [employer] or Organizations: No    Attends Banker Meetings: Not on file    Marital Status: Married  Intimate Partner Violence: Not on file    Outpatient Encounter Medications as of 04/04/2023  Medication Sig   acetaminophen (TYLENOL) 325 MG tablet Take 650 mg by mouth every 6 (six) hours as needed.   atorvastatin  (LIPITOR) 20 MG tablet Take 1 tablet (20 mg total) by mouth daily.  cholecalciferol (VITAMIN D3) 25 MCG (1000 UNIT) tablet Take 1,000 Units by mouth daily.   colchicine  0.6 MG tablet Day 1: Take 1.2 mg at the first sign of flare, followed in 1 hour with a single dose of 0.6 mg. Then 1 tablet twice daily until gout resolved.   empagliflozin  (JARDIANCE ) 25 MG TABS tablet Take 1 tablet (25 mg total) by mouth daily before breakfast.   metFORMIN  (GLUCOPHAGE ) 500 MG tablet Take 1 tablet (500 mg total) by mouth 2 (two) times daily with a meal.   sildenafil  (VIAGRA ) 100 MG tablet Take 0.5-1 tablets (50-100 mg total) by mouth daily as needed for erectile dysfunction.   sitaGLIPtin  (JANUVIA ) 100 MG tablet Take 1 tablet (100 mg  total) by mouth daily.   zinc gluconate 50 MG tablet Take 50 mg by mouth daily.   No facility-administered encounter medications on file as of 04/04/2023.    No Known Allergies  Review of Systems As per HPI  Objective:  BP 109/71   Pulse 77   Temp 98 F (36.7 C)   Ht 5' 11 (1.803 m)   Wt 216 lb (98 kg)   SpO2 96%   BMI 30.13 kg/m    Wt Readings from Last 3 Encounters:  04/04/23 216 lb (98 kg)  11/29/22 214 lb (97.1 kg)  11/22/22 214 lb (97.1 kg)    Physical Exam Constitutional:      General: He is awake. He is not in acute distress.    Appearance: Normal appearance. He is well-developed and well-groomed. He is not ill-appearing, toxic-appearing or diaphoretic.  Cardiovascular:     Rate and Rhythm: Normal rate and regular rhythm.     Pulses: Normal pulses.          Radial pulses are 2+ on the right side and 2+ on the left side.       Posterior tibial pulses are 2+ on the right side and 2+ on the left side.     Heart sounds: Normal heart sounds. No murmur heard.    No gallop.  Pulmonary:     Effort: Pulmonary effort is normal. No respiratory distress.     Breath sounds: Normal breath sounds. No stridor. No wheezing, rhonchi or rales.  Musculoskeletal:     Cervical back: Full passive range of motion without pain and neck supple.     Right lower leg: No edema.     Left lower leg: No edema.  Skin:    General: Skin is warm.     Capillary Refill: Capillary refill takes less than 2 seconds.     Findings: Abscess present.          Comments: ~2inch abscess on posterior left thigh with central opening with serosanguineous drainage. Surrounding area with ecchymosis Hard surrounding area   Neurological:     General: No focal deficit present.     Mental Status: He is alert, oriented to person, place, and time and easily aroused. Mental status is at baseline.     GCS: GCS eye subscore is 4. GCS verbal subscore is 5. GCS motor subscore is 6.     Motor: No weakness.   Psychiatric:        Attention and Perception: Attention and perception normal.        Mood and Affect: Mood and affect normal.        Speech: Speech normal.        Behavior: Behavior normal. Behavior is cooperative.  Thought Content: Thought content normal. Thought content does not include homicidal or suicidal ideation. Thought content does not include homicidal or suicidal plan.        Cognition and Memory: Cognition and memory normal.        Judgment: Judgment normal.      Results for orders placed or performed in visit on 03/06/23  Lab report - scanned   Collection Time: 01/28/23  2:18 PM  Result Value Ref Range   EGFR 48.0    Microalb Creat Ratio 29.999        04/04/2023    9:04 AM 11/29/2022    2:51 PM 11/22/2022    9:40 AM 11/22/2022    9:39 AM 08/21/2022    8:56 AM  Depression screen PHQ 2/9  Decreased Interest 0 1  0 1  Down, Depressed, Hopeless 0 0  0 0  PHQ - 2 Score 0 1  0 1  Altered sleeping 2 3 3  1   Tired, decreased energy 0 0 0  0  Change in appetite 0 0 0  0  Feeling bad or failure about yourself  0 0 0  0  Trouble concentrating 0 0 0  0  Moving slowly or fidgety/restless 0 0 0  0  Suicidal thoughts 0 0 0  0  PHQ-9 Score 2 4   2   Difficult doing work/chores Not difficult at all Not difficult at all Somewhat difficult  Not difficult at all       11/29/2022    2:52 PM 11/22/2022    9:40 AM 08/21/2022    8:57 AM 07/30/2022   10:35 AM  GAD 7 : Generalized Anxiety Score  Nervous, Anxious, on Edge 0 0 0 0  Control/stop worrying 0 0 0 0  Worry too much - different things 0 3 0 0  Trouble relaxing 1 0 0 0  Restless 0 0 0 0  Easily annoyed or irritable 1 1 0 1  Afraid - awful might happen 0 0 0 0  Total GAD 7 Score 2 4 0 1  Anxiety Difficulty Not difficult at all Not difficult at all Not difficult at all Not difficult at all    Pertinent labs & imaging results that were available during my care of the patient were reviewed by me and considered in my  medical decision making.  Assessment & Plan:  Machai Desmith was seen today for recurrent skin infections.  Diagnoses and all orders for this visit:  Cutaneous abscess of left lower extremity Antibiotic as below to cover for MRSA. Patient to continue warm compresses and expression of site. Discussed red flags.  -     doxycycline  (VIBRA -TABS) 100 MG tablet; Take 1 tablet (100 mg total) by mouth 2 (two) times daily for 10 days.     Continue all other maintenance medications.  Follow up plan: Return in about 2 weeks (around 04/18/2023) for wound check.   Continue healthy lifestyle choices, including diet (rich in fruits, vegetables, and lean proteins, and low in salt and simple carbohydrates) and exercise (at least 30 minutes of moderate physical activity daily).  Written and verbal instructions provided   The above assessment and management plan was discussed with the patient. The patient verbalized understanding of and has agreed to the management plan. Patient is aware to call the clinic if they develop any new symptoms or if symptoms persist or worsen. Patient is aware when to return to the clinic for a follow-up visit. Patient educated on  when it is appropriate to go to the emergency department.   Marry Kins, DNP-FNP [facility] [address] [place], [place] [address] [phone]

## 2023-04-09 ENCOUNTER — Other Ambulatory Visit: Payer: Self-pay

## 2023-04-09 ENCOUNTER — Other Ambulatory Visit (HOSPITAL_COMMUNITY): Payer: Self-pay

## 2023-04-10 ENCOUNTER — Ambulatory Visit: Payer: Medicare Other | Admitting: Family Medicine

## 2023-04-10 VITALS — BP 108/68 | HR 78 | Ht 71.0 in | Wt 217.0 lb

## 2023-04-10 DIAGNOSIS — E1122 Type 2 diabetes mellitus with diabetic chronic kidney disease: Secondary | ICD-10-CM | POA: Diagnosis not present

## 2023-04-10 DIAGNOSIS — E1169 Type 2 diabetes mellitus with other specified complication: Secondary | ICD-10-CM | POA: Diagnosis not present

## 2023-04-10 DIAGNOSIS — N1831 Chronic kidney disease, stage 3a: Secondary | ICD-10-CM | POA: Diagnosis not present

## 2023-04-10 DIAGNOSIS — E785 Hyperlipidemia, unspecified: Secondary | ICD-10-CM

## 2023-04-10 DIAGNOSIS — Z7984 Long term (current) use of oral hypoglycemic drugs: Secondary | ICD-10-CM

## 2023-04-10 DIAGNOSIS — Z125 Encounter for screening for malignant neoplasm of prostate: Secondary | ICD-10-CM | POA: Diagnosis not present

## 2023-04-10 LAB — LIPID PANEL

## 2023-04-10 LAB — BAYER DCA HB A1C WAIVED: HB A1C (BAYER DCA - WAIVED): 5.9 % — ABNORMAL HIGH (ref 4.8–5.6)

## 2023-04-10 NOTE — Progress Notes (Signed)
 BP 108/68   Pulse 78   Ht 5\' 11"  (1.803 m)   Wt 217 lb (98.4 kg)   SpO2 97%   BMI 30.27 kg/m    Subjective:   Patient ID: Matthew Bolton, male    DOB: Nov 06, 1955, 68 y.o.   MRN: [id number]  HPI: Matthew Bolton is a 68 y.o. male presenting on 04/10/2023 for Medical Management of Chronic Issues and Diabetes   HPI Type 2 diabetes mellitus Patient comes in today for recheck of his diabetes. Patient has been currently taking metformin  and Jardiance  and Januvia . Patient is not currently on an ACE inhibitor/ARB. Patient has not seen an ophthalmologist this year. Patient denies any new issues with their feet. The symptom started onset as an adult CKD 3 and hyperlipidemia ARE RELATED TO DM   Hyperlipidemia Patient is coming in for recheck of his hyperlipidemia. The patient is currently taking atorvastatin . They deny any issues with myalgias or history of liver damage from it. They deny any focal numbness or weakness or chest pain.   Patient had a recent abscess or boil on his posterior upper leg.  He says he just started the antibiotics yesterday and feels like he is looking a lot better.  Relevant past medical, surgical, family and social history reviewed and updated as indicated. Interim medical history since our last visit reviewed. Allergies and medications reviewed and updated.  Review of Systems  Constitutional:  Negative for chills and fever.  Eyes:  Negative for visual disturbance.  Respiratory:  Negative for shortness of breath and wheezing.   Cardiovascular:  Negative for chest pain and leg swelling.  Musculoskeletal:  Negative for back pain and gait problem.  Skin:  Negative for rash.  All other systems reviewed and are negative.   Per HPI unless specifically indicated above   Allergies as of 04/10/2023   No Known Allergies      Medication List        Accurate as of April 10, 2023  3:02 PM. If you have any questions, ask your nurse or doctor.           acetaminophen 325 MG tablet Commonly known as: TYLENOL Take 650 mg by mouth every 6 (six) hours as needed.   atorvastatin  20 MG tablet Commonly known as: LIPITOR Take 1 tablet (20 mg total) by mouth daily.   cholecalciferol 25 MCG (1000 UNIT) tablet Commonly known as: VITAMIN D3 Take 1,000 Units by mouth daily.   colchicine  0.6 MG tablet Day 1: Take 1.2 mg at the first sign of flare, followed in 1 hour with a single dose of 0.6 mg. Then 1 tablet twice daily until gout resolved.   doxycycline  100 MG tablet Commonly known as: VIBRA -TABS Take 1 tablet (100 mg total) by mouth 2 (two) times daily for 10 days.   Januvia  100 MG tablet Generic drug: sitaGLIPtin  Take 1 tablet (100 mg total) by mouth daily.   Jardiance  25 MG Tabs tablet Generic drug: empagliflozin  Take 1 tablet (25 mg total) by mouth daily before breakfast.   metFORMIN  500 MG tablet Commonly known as: GLUCOPHAGE  Take 1 tablet (500 mg total) by mouth 2 (two) times daily with a meal.   sildenafil  100 MG tablet Commonly known as: Viagra  Take 0.5-1 tablets (50-100 mg total) by mouth daily as needed for erectile dysfunction.   zinc gluconate 50 MG tablet Take 50 mg by mouth daily.         Objective:   BP 108/68   Pulse 78  Ht 5\' 11"  (1.803 m)   Wt 217 lb (98.4 kg)   SpO2 97%   BMI 30.27 kg/m   Wt Readings from Last 3 Encounters:  04/10/23 217 lb (98.4 kg)  04/04/23 216 lb (98 kg)  11/29/22 214 lb (97.1 kg)    Physical Exam Vitals and nursing note reviewed.  Constitutional:      General: He is not in acute distress.    Appearance: He is well-developed. He is not diaphoretic.  Eyes:     General: No scleral icterus.    Conjunctiva/sclera: Conjunctivae normal.  Neck:     Thyroid: No thyromegaly.  Cardiovascular:     Rate and Rhythm: Normal rate and regular rhythm.     Heart sounds: Normal heart sounds. No murmur heard. Pulmonary:     Effort: Pulmonary effort is normal. No respiratory distress.      Breath sounds: Normal breath sounds. No wheezing.  Musculoskeletal:        General: Normal range of motion.     Cervical back: Neck supple.  Lymphadenopathy:     Cervical: No cervical adenopathy.  Skin:    General: Skin is warm and dry.     Findings: Lesion (Small area of erythema with central ulceration, ulceration is 0.2 cm and erythema is close to a centimeter) present. No rash.  Neurological:     Mental Status: He is alert and oriented to person, place, and time.     Coordination: Coordination normal.  Psychiatric:        Behavior: Behavior normal.       Assessment & Plan:   Problem List Items Addressed This Visit       Endocrine   Type 2 diabetes mellitus with stage 3a chronic kidney disease, without long-term current use of insulin (HCC) - Primary   Relevant Orders   CBC with Differential/Platelet   CMP14+EGFR   Lipid panel   Bayer DCA Hb A1c Waived   Hyperlipidemia associated with type 2 diabetes mellitus (HCC)     Genitourinary   Stage 3a chronic kidney disease (CKD) (HCC)   Other Visit Diagnoses       Prostate cancer screening       Relevant Orders   PSA, total and free       Continue doxycycline  for abscess.  A1c is 5.9 which is still really good.  Blood pressure and everything else looks good.  No changes Follow up plan: Return in about 3 months (around 07/09/2023), or if symptoms worsen or fail to improve, for Diabetes recheck.  Counseling provided for all of the vaccine components Orders Placed This Encounter  Procedures   CBC with Differential/Platelet   CMP14+EGFR   Lipid panel   PSA, total and free   Bayer DCA Hb A1c Waived    Jolyne Needs, MD [facility] 04/10/2023, 3:02 PM

## 2023-04-11 ENCOUNTER — Other Ambulatory Visit: Payer: Self-pay

## 2023-04-11 ENCOUNTER — Other Ambulatory Visit (HOSPITAL_COMMUNITY): Payer: Self-pay

## 2023-04-11 LAB — CMP14+EGFR
ALT: 16 [IU]/L (ref 0–44)
AST: 17 IU/L (ref 0–40)
Albumin: 4.2 g/dL (ref 3.9–4.9)
Alkaline Phosphatase: 83 [IU]/L (ref 44–121)
BUN/Creatinine Ratio: 15 (ref 10–24)
BUN: 24 mg/dL (ref 8–27)
Bilirubin Total: 0.5 mg/dL (ref 0.0–1.2)
CO2: 25 mmol/L (ref 20–29)
Calcium: 9.2 mg/dL (ref 8.6–10.2)
Chloride: 104 mmol/L (ref 96–106)
Creatinine, Ser: 1.62 mg/dL — ABNORMAL HIGH (ref 0.76–1.27)
Globulin, Total: 2.7 g/dL (ref 1.5–4.5)
Glucose: 88 mg/dL (ref 70–99)
Potassium: 4.9 mmol/L (ref 3.5–5.2)
Sodium: 141 mmol/L (ref 134–144)
Total Protein: 6.9 g/dL (ref 6.0–8.5)
eGFR: 47 mL/min/{1.73_m2} — ABNORMAL LOW (ref >59)

## 2023-04-11 LAB — CBC WITH DIFFERENTIAL/PLATELET
Basophils Absolute: 0 10*3/uL (ref 0.0–0.2)
Basos: 1 %
EOS (ABSOLUTE): 0.3 10*3/uL (ref 0.0–0.4)
Eos: 4 %
Hematocrit: 50.3 % (ref 37.5–51.0)
Hemoglobin: 16.7 g/dL (ref 13.0–17.7)
Immature Grans (Abs): 0 10*3/uL (ref 0.0–0.1)
Immature Granulocytes: 0 %
Lymphocytes Absolute: 2.3 10*3/uL (ref 0.7–3.1)
Lymphs: 34 %
MCH: 30 pg (ref 26.6–33.0)
MCHC: 33.2 g/dL (ref 31.5–35.7)
MCV: 91 fL (ref 79–97)
Monocytes Absolute: 0.6 10*3/uL (ref 0.1–0.9)
Monocytes: 8 %
Neutrophils Absolute: 3.6 10*3/uL (ref 1.4–7.0)
Neutrophils: 53 %
Platelets: 148 10*3/uL — ABNORMAL LOW (ref 150–450)
RBC: 5.56 x10E6/uL (ref 4.14–5.80)
RDW: 13.5 % (ref 11.6–15.4)
WBC: 6.8 10*3/uL (ref 3.4–10.8)

## 2023-04-11 LAB — LIPID PANEL
Cholesterol, Total: 132 mg/dL (ref 100–199)
HDL: 40 mg/dL (ref >39)
LDL CALC COMMENT:: 3.3 {ratio} (ref 0.0–5.0)
LDL Chol Calc (NIH): 59 mg/dL (ref 0–99)
Triglycerides: 198 mg/dL — ABNORMAL HIGH (ref 0–149)
VLDL Cholesterol Cal: 33 mg/dL (ref 5–40)

## 2023-04-11 LAB — PSA, TOTAL AND FREE
PSA, Free Pct: 35 %
PSA, Free: 1.4 ng/mL
Prostate Specific Ag, Serum: 4 ng/mL (ref 0.0–4.0)

## 2023-04-12 ENCOUNTER — Other Ambulatory Visit (HOSPITAL_COMMUNITY): Payer: Self-pay

## 2023-04-17 ENCOUNTER — Encounter: Payer: Self-pay | Admitting: Family Medicine

## 2023-04-29 ENCOUNTER — Encounter: Payer: Self-pay | Admitting: Pharmacist

## 2023-04-29 ENCOUNTER — Other Ambulatory Visit (HOSPITAL_BASED_OUTPATIENT_CLINIC_OR_DEPARTMENT_OTHER): Payer: Self-pay

## 2023-04-29 ENCOUNTER — Other Ambulatory Visit: Payer: Self-pay

## 2023-04-29 ENCOUNTER — Other Ambulatory Visit (HOSPITAL_COMMUNITY): Payer: Self-pay

## 2023-04-30 ENCOUNTER — Other Ambulatory Visit (HOSPITAL_COMMUNITY): Payer: Self-pay

## 2023-05-01 ENCOUNTER — Other Ambulatory Visit (HOSPITAL_COMMUNITY): Payer: Self-pay

## 2023-05-11 ENCOUNTER — Other Ambulatory Visit (HOSPITAL_COMMUNITY): Payer: Self-pay

## 2023-05-13 ENCOUNTER — Other Ambulatory Visit (HOSPITAL_BASED_OUTPATIENT_CLINIC_OR_DEPARTMENT_OTHER): Payer: Self-pay

## 2023-05-13 ENCOUNTER — Other Ambulatory Visit: Payer: Self-pay

## 2023-05-13 ENCOUNTER — Other Ambulatory Visit (HOSPITAL_COMMUNITY): Payer: Self-pay

## 2023-05-31 ENCOUNTER — Other Ambulatory Visit (HOSPITAL_COMMUNITY): Payer: Self-pay

## 2023-06-06 DIAGNOSIS — H3540 Unspecified peripheral retinal degeneration: Secondary | ICD-10-CM | POA: Diagnosis not present

## 2023-06-06 LAB — HM DIABETES EYE EXAM

## 2023-06-10 ENCOUNTER — Other Ambulatory Visit (HOSPITAL_COMMUNITY): Payer: Self-pay

## 2023-06-24 ENCOUNTER — Other Ambulatory Visit: Payer: Self-pay | Admitting: Family Medicine

## 2023-07-02 DIAGNOSIS — K080 Exfoliation of teeth due to systemic causes: Secondary | ICD-10-CM | POA: Diagnosis not present

## 2023-07-08 ENCOUNTER — Other Ambulatory Visit (HOSPITAL_COMMUNITY): Payer: Self-pay

## 2023-07-17 ENCOUNTER — Encounter: Payer: Self-pay | Admitting: Family Medicine

## 2023-07-17 ENCOUNTER — Other Ambulatory Visit: Payer: Self-pay

## 2023-07-17 ENCOUNTER — Other Ambulatory Visit (HOSPITAL_COMMUNITY): Payer: Self-pay

## 2023-07-17 ENCOUNTER — Ambulatory Visit: Payer: Medicare Other | Admitting: Family Medicine

## 2023-07-17 VITALS — BP 110/70 | HR 78 | Ht 71.0 in | Wt 221.0 lb

## 2023-07-17 DIAGNOSIS — L02211 Cutaneous abscess of abdominal wall: Secondary | ICD-10-CM

## 2023-07-17 DIAGNOSIS — E785 Hyperlipidemia, unspecified: Secondary | ICD-10-CM | POA: Diagnosis not present

## 2023-07-17 DIAGNOSIS — E1122 Type 2 diabetes mellitus with diabetic chronic kidney disease: Secondary | ICD-10-CM | POA: Diagnosis not present

## 2023-07-17 DIAGNOSIS — N529 Male erectile dysfunction, unspecified: Secondary | ICD-10-CM | POA: Diagnosis not present

## 2023-07-17 DIAGNOSIS — Z7984 Long term (current) use of oral hypoglycemic drugs: Secondary | ICD-10-CM

## 2023-07-17 DIAGNOSIS — E1169 Type 2 diabetes mellitus with other specified complication: Secondary | ICD-10-CM

## 2023-07-17 DIAGNOSIS — N1831 Chronic kidney disease, stage 3a: Secondary | ICD-10-CM

## 2023-07-17 LAB — LIPID PANEL

## 2023-07-17 LAB — BAYER DCA HB A1C WAIVED: HB A1C (BAYER DCA - WAIVED): 5.9 % — ABNORMAL HIGH (ref 4.8–5.6)

## 2023-07-17 MED ORDER — SITAGLIPTIN PHOSPHATE 100 MG PO TABS
100.0000 mg | ORAL_TABLET | Freq: Every day | ORAL | 3 refills | Status: AC
  Filled 2023-07-17: qty 90, 90d supply, fill #0
  Filled 2023-08-13: qty 30, 30d supply, fill #0
  Filled 2023-09-22: qty 30, 30d supply, fill #1
  Filled 2023-11-01: qty 30, 30d supply, fill #0
  Filled 2023-12-04: qty 30, 30d supply, fill #1
  Filled 2024-01-02: qty 30, 30d supply, fill #2
  Filled 2024-01-29: qty 30, 30d supply, fill #3
  Filled 2024-02-28: qty 30, 30d supply, fill #4
  Filled 2024-04-04: qty 30, 30d supply, fill #5

## 2023-07-17 MED ORDER — ATORVASTATIN CALCIUM 20 MG PO TABS
20.0000 mg | ORAL_TABLET | Freq: Every day | ORAL | 3 refills | Status: AC
  Filled 2023-07-17 – 2024-01-24 (×3): qty 90, 90d supply, fill #0

## 2023-07-17 MED ORDER — METFORMIN HCL 500 MG PO TABS
500.0000 mg | ORAL_TABLET | Freq: Two times a day (BID) | ORAL | 3 refills | Status: AC
  Filled 2023-07-17 – 2024-02-27 (×2): qty 180, 90d supply, fill #0

## 2023-07-17 MED ORDER — SILDENAFIL CITRATE 100 MG PO TABS
50.0000 mg | ORAL_TABLET | Freq: Every day | ORAL | 3 refills | Status: AC | PRN
  Filled 2023-07-17 – 2023-11-01 (×2): qty 20, 20d supply, fill #0
  Filled 2024-01-28: qty 20, 20d supply, fill #1
  Filled 2024-04-04: qty 20, 20d supply, fill #2

## 2023-07-17 MED ORDER — SULFAMETHOXAZOLE-TRIMETHOPRIM 800-160 MG PO TABS
1.0000 | ORAL_TABLET | Freq: Two times a day (BID) | ORAL | 0 refills | Status: DC
  Filled 2023-07-17: qty 20, 10d supply, fill #0

## 2023-07-17 MED ORDER — EMPAGLIFLOZIN 25 MG PO TABS
25.0000 mg | ORAL_TABLET | Freq: Every day | ORAL | 3 refills | Status: AC
  Filled 2023-07-17: qty 90, 90d supply, fill #0
  Filled 2023-08-13: qty 30, 30d supply, fill #0
  Filled 2023-09-09: qty 30, 30d supply, fill #1
  Filled 2023-10-11: qty 30, 30d supply, fill #2
  Filled 2023-11-26: qty 30, 30d supply, fill #0
  Filled 2023-12-29: qty 30, 30d supply, fill #1
  Filled 2024-01-28: qty 30, 30d supply, fill #2
  Filled 2024-02-27: qty 30, 30d supply, fill #3
  Filled 2024-04-04: qty 30, 30d supply, fill #4

## 2023-07-17 NOTE — Progress Notes (Signed)
 BP 110/70   Pulse 78   Ht 5\' 11"  (1.803 m)   Wt 221 lb (100.2 kg)   SpO2 94%   BMI 30.82 kg/m    Subjective:   Patient ID: Matthew Bolton, male    DOB: 01/06/1956, 68 y.o.   MRN: [id number]  HPI: Matthew Bolton is a 68 y.o. male presenting on 07/17/2023 for Medical Management of Chronic Issues and Diabetes   HPI Type 2 diabetes mellitus Patient comes in today for recheck of his diabetes. Patient has been currently taking Jardiance  and metformin  and Januvia . Patient is not currently on an ACE inhibitor/ARB. Patient has seen an ophthalmologist this year. Patient denies any new issues with their feet. The symptom started onset as an adult CKD and hyper lipidemia ARE RELATED TO DM   Hyperlipidemia Patient is coming in for recheck of his hyperlipidemia. The patient is currently taking atorvastatin . They deny any issues with myalgias or history of liver damage from it. They deny any focal numbness or weakness or chest pain.   Patient has an abscess on his right lower belt line on his abdomen that he has noticed over the past week.  It has been draining purulent material.  He was working outside a lot earlier this week and feels like he irritated it not so it got infected.  Relevant past medical, surgical, family and social history reviewed and updated as indicated. Interim medical history since our last visit reviewed. Allergies and medications reviewed and updated.  Review of Systems  Constitutional:  Negative for chills and fever.  Eyes:  Negative for visual disturbance.  Respiratory:  Negative for shortness of breath and wheezing.   Cardiovascular:  Negative for chest pain and leg swelling.  Musculoskeletal:  Negative for back pain and gait problem.  Skin:  Negative for rash.  Neurological:  Negative for dizziness and light-headedness.  All other systems reviewed and are negative.   Per HPI unless specifically indicated above   Allergies as of 07/17/2023   No Known  Allergies      Medication List        Accurate as of July 17, 2023  2:18 PM. If you have any questions, ask your nurse or doctor.          acetaminophen 325 MG tablet Commonly known as: TYLENOL Take 650 mg by mouth every 6 (six) hours as needed.   atorvastatin  20 MG tablet Commonly known as: LIPITOR Take 1 tablet (20 mg total) by mouth daily.   cholecalciferol 25 MCG (1000 UNIT) tablet Commonly known as: VITAMIN D3 Take 1,000 Units by mouth daily.   colchicine  0.6 MG tablet Day 1: Take 1.2 mg at the first sign of flare, followed in 1 hour with a single dose of 0.6 mg. Then 1 tablet twice daily until gout resolved.   empagliflozin  25 MG Tabs tablet Commonly known as: Jardiance  Take 1 tablet (25 mg total) by mouth daily before breakfast.   metFORMIN  500 MG tablet Commonly known as: GLUCOPHAGE  Take 1 tablet (500 mg total) by mouth 2 (two) times daily with a meal.   sildenafil  100 MG tablet Commonly known as: Viagra  Take 0.5-1 tablets (50-100 mg total) by mouth daily as needed for erectile dysfunction.   sitaGLIPtin  100 MG tablet Commonly known as: JANUVIA  Take 1 tablet (100 mg total) by mouth daily.   sulfamethoxazole -trimethoprim  800-160 MG tablet Commonly known as: BACTRIM  DS Take 1 tablet by mouth 2 (two) times daily. Started by: Lucio Sabin Jaimes Eckert  zinc gluconate 50 MG tablet Take 50 mg by mouth daily.         Objective:   BP 110/70   Pulse 78   Ht 5\' 11"  (1.803 m)   Wt 221 lb (100.2 kg)   SpO2 94%   BMI 30.82 kg/m   Wt Readings from Last 3 Encounters:  07/17/23 221 lb (100.2 kg)  04/10/23 217 lb (98.4 kg)  04/04/23 216 lb (98 kg)    Physical Exam Vitals and nursing note reviewed.  Constitutional:      General: He is not in acute distress.    Appearance: He is well-developed. He is not diaphoretic.  Eyes:     General: No scleral icterus.    Conjunctiva/sclera: Conjunctivae normal.  Neck:     Thyroid: No thyromegaly.  Cardiovascular:      Rate and Rhythm: Normal rate and regular rhythm.     Heart sounds: Normal heart sounds. No murmur heard. Pulmonary:     Effort: Pulmonary effort is normal. No respiratory distress.     Breath sounds: Normal breath sounds. No wheezing.  Musculoskeletal:        General: No swelling. Normal range of motion.     Cervical back: Neck supple.  Lymphadenopathy:     Cervical: No cervical adenopathy.  Skin:    General: Skin is warm and dry.     Findings: Erythema and lesion (Open and draining abscess with slight erythema on the right lower abdomen) present. No rash.  Neurological:     Mental Status: He is alert and oriented to person, place, and time.     Coordination: Coordination normal.  Psychiatric:        Behavior: Behavior normal.     Results for orders placed or performed in visit on 06/17/23  HM DIABETES EYE EXAM   Collection Time: 06/06/23  3:12 PM  Result Value Ref Range   HM Diabetic Eye Exam No Retinopathy No Retinopathy    Assessment & Plan:   Problem List Items Addressed This Visit       Endocrine   Type 2 diabetes mellitus with stage 3a chronic kidney disease, without long-term current use of insulin (HCC) - Primary   Relevant Medications   atorvastatin  (LIPITOR) 20 MG tablet   sitaGLIPtin  (JANUVIA ) 100 MG tablet   metFORMIN  (GLUCOPHAGE ) 500 MG tablet   empagliflozin  (JARDIANCE ) 25 MG TABS tablet   Other Relevant Orders   Bayer DCA Hb A1c Waived   CBC with Differential/Platelet   CMP14+EGFR   Lipid panel   Hyperlipidemia associated with type 2 diabetes mellitus (HCC)   Relevant Medications   atorvastatin  (LIPITOR) 20 MG tablet   sildenafil  (VIAGRA ) 100 MG tablet   sitaGLIPtin  (JANUVIA ) 100 MG tablet   metFORMIN  (GLUCOPHAGE ) 500 MG tablet   empagliflozin  (JARDIANCE ) 25 MG TABS tablet   Other Relevant Orders   Bayer DCA Hb A1c Waived   CBC with Differential/Platelet   CMP14+EGFR   Lipid panel     Genitourinary   Stage 3a chronic kidney disease (CKD)  (HCC)   Other Visit Diagnoses       Erectile dysfunction, unspecified erectile dysfunction type       Relevant Medications   sildenafil  (VIAGRA ) 100 MG tablet     Abscess of skin of abdomen       Relevant Medications   sulfamethoxazole -trimethoprim  (BACTRIM  DS) 800-160 MG tablet       Sent Bactrim  for his abscess and continue to express it and keep it draining.  A1c looks good at 5.9, continue to watch diet. Follow up plan: Return in about 3 months (around 10/16/2023), or if symptoms worsen or fail to improve, for Diabetes.  Counseling provided for all of the vaccine components Orders Placed This Encounter  Procedures   Bayer DCA Hb A1c Waived   CBC with Differential/Platelet   CMP14+EGFR   Lipid panel    Jolyne Needs, MD [facility] 07/17/2023, 2:18 PM

## 2023-07-18 ENCOUNTER — Encounter: Payer: Self-pay | Admitting: Family Medicine

## 2023-07-18 LAB — CMP14+EGFR
ALT: 13 IU/L (ref 0–44)
AST: 16 IU/L (ref 0–40)
Albumin: 4 g/dL (ref 3.9–4.9)
Alkaline Phosphatase: 84 IU/L (ref 44–121)
BUN/Creatinine Ratio: 16 (ref 10–24)
BUN: 22 mg/dL (ref 8–27)
Bilirubin Total: 0.5 mg/dL (ref 0.0–1.2)
CO2: 20 mmol/L (ref 20–29)
Calcium: 9.1 mg/dL (ref 8.6–10.2)
Chloride: 105 mmol/L (ref 96–106)
Creatinine, Ser: 1.35 mg/dL — ABNORMAL HIGH (ref 0.76–1.27)
Globulin, Total: 2.8 g/dL (ref 1.5–4.5)
Glucose: 117 mg/dL — ABNORMAL HIGH (ref 70–99)
Potassium: 5 mmol/L (ref 3.5–5.2)
Sodium: 142 mmol/L (ref 134–144)
Total Protein: 6.8 g/dL (ref 6.0–8.5)
eGFR: 58 mL/min/{1.73_m2} — ABNORMAL LOW (ref >59)

## 2023-07-18 LAB — CBC WITH DIFFERENTIAL/PLATELET
Basophils Absolute: 0 10*3/uL (ref 0.0–0.2)
Basos: 0 %
EOS (ABSOLUTE): 0.2 10*3/uL (ref 0.0–0.4)
Eos: 3 %
Hematocrit: 49.8 % (ref 37.5–51.0)
Hemoglobin: 16.5 g/dL (ref 13.0–17.7)
Immature Grans (Abs): 0 10*3/uL (ref 0.0–0.1)
Immature Granulocytes: 0 %
Lymphocytes Absolute: 2.1 10*3/uL (ref 0.7–3.1)
Lymphs: 39 %
MCH: 30.1 pg (ref 26.6–33.0)
MCHC: 33.1 g/dL (ref 31.5–35.7)
MCV: 91 fL (ref 79–97)
Monocytes Absolute: 0.4 10*3/uL (ref 0.1–0.9)
Monocytes: 8 %
Neutrophils Absolute: 2.7 10*3/uL (ref 1.4–7.0)
Neutrophils: 50 %
Platelets: 151 10*3/uL (ref 150–450)
RBC: 5.48 x10E6/uL (ref 4.14–5.80)
RDW: 13.8 % (ref 11.6–15.4)
WBC: 5.3 10*3/uL (ref 3.4–10.8)

## 2023-07-18 LAB — LIPID PANEL
Cholesterol, Total: 135 mg/dL (ref 100–199)
HDL: 38 mg/dL — ABNORMAL LOW (ref >39)
LDL CALC COMMENT:: 3.6 ratio (ref 0.0–5.0)
LDL Chol Calc (NIH): 69 mg/dL (ref 0–99)
Triglycerides: 160 mg/dL — ABNORMAL HIGH (ref 0–149)
VLDL Cholesterol Cal: 28 mg/dL (ref 5–40)

## 2023-08-13 ENCOUNTER — Other Ambulatory Visit (HOSPITAL_COMMUNITY): Payer: Self-pay

## 2023-08-13 ENCOUNTER — Encounter: Payer: Self-pay | Admitting: Pharmacist

## 2023-08-13 ENCOUNTER — Other Ambulatory Visit: Payer: Self-pay

## 2023-08-14 ENCOUNTER — Other Ambulatory Visit: Payer: Self-pay

## 2023-08-14 ENCOUNTER — Other Ambulatory Visit (HOSPITAL_COMMUNITY): Payer: Self-pay

## 2023-09-09 ENCOUNTER — Other Ambulatory Visit (HOSPITAL_COMMUNITY): Payer: Self-pay

## 2023-09-23 ENCOUNTER — Other Ambulatory Visit (HOSPITAL_COMMUNITY): Payer: Self-pay

## 2023-10-11 ENCOUNTER — Other Ambulatory Visit (HOSPITAL_COMMUNITY): Payer: Self-pay

## 2023-10-12 ENCOUNTER — Other Ambulatory Visit (HOSPITAL_COMMUNITY): Payer: Self-pay

## 2023-10-16 ENCOUNTER — Other Ambulatory Visit (HOSPITAL_BASED_OUTPATIENT_CLINIC_OR_DEPARTMENT_OTHER): Payer: Self-pay

## 2023-10-16 ENCOUNTER — Encounter: Payer: Self-pay | Admitting: Family Medicine

## 2023-10-16 ENCOUNTER — Ambulatory Visit: Admitting: Family Medicine

## 2023-10-16 VITALS — BP 96/63 | HR 81 | Ht 71.0 in | Wt 214.0 lb

## 2023-10-16 DIAGNOSIS — E1122 Type 2 diabetes mellitus with diabetic chronic kidney disease: Secondary | ICD-10-CM | POA: Diagnosis not present

## 2023-10-16 DIAGNOSIS — N1831 Chronic kidney disease, stage 3a: Secondary | ICD-10-CM

## 2023-10-16 DIAGNOSIS — Z7984 Long term (current) use of oral hypoglycemic drugs: Secondary | ICD-10-CM

## 2023-10-16 DIAGNOSIS — E785 Hyperlipidemia, unspecified: Secondary | ICD-10-CM | POA: Diagnosis not present

## 2023-10-16 DIAGNOSIS — E1169 Type 2 diabetes mellitus with other specified complication: Secondary | ICD-10-CM | POA: Diagnosis not present

## 2023-10-16 LAB — BAYER DCA HB A1C WAIVED: HB A1C (BAYER DCA - WAIVED): 5.9 % — ABNORMAL HIGH (ref 4.8–5.6)

## 2023-10-16 LAB — LIPID PANEL

## 2023-10-16 NOTE — Progress Notes (Signed)
 BP 96/63   Pulse 81   Ht 5' 11 (1.803 m)   Wt 214 lb (97.1 kg)   SpO2 95%   BMI 29.85 kg/m    Subjective:   Patient ID: Matthew Bolton, male    DOB: [birth year], 68 y.o.   MRN: [id number]  HPI: Matthew Bolton is a 68 y.o. male presenting on 10/16/2023 for Medical Management of Chronic Issues, Diabetes, Hyperlipidemia, and nodule (Right groin. ? boil)   HPI Type 2 diabetes mellitus Patient comes in today for recheck of his diabetes. Patient has been currently taking metformin  and Jardiance  and Januvia . Patient is not currently on an ACE inhibitor/ARB. Patient has not seen an ophthalmologist this year. Patient denies any new issues with their feet. The symptom started onset as an adult CKD stage III and hyperlipidemia ARE RELATED TO DM   Hyperlipidemia Patient is coming in for recheck of his hyperlipidemia. The patient is currently taking atorvastatin . They deny any issues with myalgias or history of liver damage from it. They deny any focal numbness or weakness or chest pain.   Ingrown hair Patient suspected ingrown hair in his right groin that he just noticed yesterday.  He says it feels like it is a little bit bigger than yesterday, it started as a small pimple yesterday.  It is a little sore but not draining and he denies any fevers or chills or redness or warmth anywhere else.  He has not used anything for it yet.  Relevant past medical, surgical, family and social history reviewed and updated as indicated. Interim medical history since our last visit reviewed. Allergies and medications reviewed and updated.  Review of Systems  Constitutional:  Negative for chills and fever.  Eyes:  Negative for discharge.  Respiratory:  Negative for shortness of breath and wheezing.   Cardiovascular:  Negative for chest pain and leg swelling.  Musculoskeletal:  Negative for back pain and gait problem.  Skin:  Positive for color change and rash.  All other systems reviewed and are  negative.   Per HPI unless specifically indicated above   Allergies as of 10/16/2023   No Known Allergies      Medication List        Accurate as of October 16, 2023  8:57 AM. If you have any questions, ask your nurse or doctor.          STOP taking these medications    sulfamethoxazole -trimethoprim  800-160 MG tablet Commonly known as: BACTRIM  DS Stopped by: Fonda LABOR Adrain Nesbit       TAKE these medications    acetaminophen 325 MG tablet Commonly known as: TYLENOL Take 650 mg by mouth every 6 (six) hours as needed.   atorvastatin  20 MG tablet Commonly known as: LIPITOR Take 1 tablet (20 mg total) by mouth daily.   cholecalciferol 25 MCG (1000 UNIT) tablet Commonly known as: VITAMIN D3 Take 1,000 Units by mouth daily.   colchicine  0.6 MG tablet Day 1: Take 1.2 mg at the first sign of flare, followed in 1 hour with a single dose of 0.6 mg. Then 1 tablet twice daily until gout resolved.   Januvia  100 MG tablet Generic drug: sitaGLIPtin  Take 1 tablet (100 mg total) by mouth daily.   Jardiance  25 MG Tabs tablet Generic drug: empagliflozin  Take 1 tablet (25 mg total) by mouth daily before breakfast.   metFORMIN  500 MG tablet Commonly known as: GLUCOPHAGE  Take 1 tablet (500 mg total) by mouth 2 (two) times daily with a meal.  sildenafil  100 MG tablet Commonly known as: Viagra  Take 0.5-1 tablets (50-100 mg total) by mouth daily as needed for erectile dysfunction.   zinc gluconate 50 MG tablet Take 50 mg by mouth daily.         Objective:   BP 96/63   Pulse 81   Ht 5' 11 (1.803 m)   Wt 214 lb (97.1 kg)   SpO2 95%   BMI 29.85 kg/m   Wt Readings from Last 3 Encounters:  10/16/23 214 lb (97.1 kg)  07/17/23 221 lb (100.2 kg)  04/10/23 217 lb (98.4 kg)    Physical Exam Vitals and nursing note reviewed.  Constitutional:      General: He is not in acute distress.    Appearance: He is well-developed. He is not diaphoretic.  Eyes:     General: No  scleral icterus.    Conjunctiva/sclera: Conjunctivae normal.  Neck:     Thyroid: No thyromegaly.  Cardiovascular:     Rate and Rhythm: Normal rate and regular rhythm.     Heart sounds: Normal heart sounds. No murmur heard. Pulmonary:     Effort: Pulmonary effort is normal. No respiratory distress.     Breath sounds: Normal breath sounds. No wheezing.  Musculoskeletal:        General: No swelling. Normal range of motion.     Cervical back: Neck supple.  Lymphadenopathy:     Cervical: No cervical adenopathy.  Skin:    General: Skin is warm and dry.     Findings: Lesion (Small papule on right mons pubis, less than 0.1 cm area of induration.) present. No rash.  Neurological:     Mental Status: He is alert and oriented to person, place, and time.     Coordination: Coordination normal.  Psychiatric:        Behavior: Behavior normal.       Assessment & Plan:   Problem List Items Addressed This Visit       Endocrine   Type 2 diabetes mellitus with stage 3a chronic kidney disease, without long-term current use of insulin (HCC) - Primary   Relevant Orders   Bayer DCA Hb A1c Waived   CBC with Differential/Platelet   CMP14+EGFR   Lipid panel   Hyperlipidemia associated with type 2 diabetes mellitus (HCC)     Genitourinary   Stage 3a chronic kidney disease (CKD) (HCC)    A1 C looks good at 5.9.  Blood pressure slightly on the lower side but it has been hotter and has been outside a lot and he does admit that he is been sweating a lot.  He will try to hydrate a little bit better but he is asymptomatic. Follow up plan: Return in about 3 months (around 01/16/2024), or if symptoms worsen or fail to improve, for Diabetes.  Counseling provided for all of the vaccine components Orders Placed This Encounter  Procedures   Bayer DCA Hb A1c Waived   CBC with Differential/Platelet   CMP14+EGFR   Lipid panel    Fonda Levins, MD [facility] 10/16/2023, 8:57  AM

## 2023-10-17 LAB — CBC WITH DIFFERENTIAL/PLATELET
Basophils Absolute: 0.1 x10E3/uL (ref 0.0–0.2)
Basos: 1 %
EOS (ABSOLUTE): 0.2 x10E3/uL (ref 0.0–0.4)
Eos: 4 %
Hematocrit: 54.1 % — ABNORMAL HIGH (ref 37.5–51.0)
Hemoglobin: 17.5 g/dL (ref 13.0–17.7)
Immature Grans (Abs): 0 x10E3/uL (ref 0.0–0.1)
Immature Granulocytes: 0 %
Lymphocytes Absolute: 1.5 x10E3/uL (ref 0.7–3.1)
Lymphs: 26 %
MCH: 30.2 pg (ref 26.6–33.0)
MCHC: 32.3 g/dL (ref 31.5–35.7)
MCV: 93 fL (ref 79–97)
Monocytes Absolute: 0.4 x10E3/uL (ref 0.1–0.9)
Monocytes: 7 %
Neutrophils Absolute: 3.8 x10E3/uL (ref 1.4–7.0)
Neutrophils: 62 %
Platelets: 134 x10E3/uL — ABNORMAL LOW (ref 150–450)
RBC: 5.8 x10E6/uL (ref 4.14–5.80)
RDW: 14.1 % (ref 11.6–15.4)
WBC: 6 x10E3/uL (ref 3.4–10.8)

## 2023-10-17 LAB — CMP14+EGFR
ALT: 16 IU/L (ref 0–44)
AST: 19 IU/L (ref 0–40)
Albumin: 4 g/dL (ref 3.9–4.9)
Alkaline Phosphatase: 85 IU/L (ref 44–121)
BUN/Creatinine Ratio: 16 (ref 10–24)
BUN: 26 mg/dL (ref 8–27)
Bilirubin Total: 0.5 mg/dL (ref 0.0–1.2)
CO2: 18 mmol/L — AB (ref 20–29)
Calcium: 8.9 mg/dL (ref 8.6–10.2)
Chloride: 104 mmol/L (ref 96–106)
Creatinine, Ser: 1.59 mg/dL — AB (ref 0.76–1.27)
Globulin, Total: 2.8 g/dL (ref 1.5–4.5)
Glucose: 213 mg/dL — AB (ref 70–99)
Potassium: 4.4 mmol/L (ref 3.5–5.2)
Sodium: 139 mmol/L (ref 134–144)
Total Protein: 6.8 g/dL (ref 6.0–8.5)
eGFR: 48 mL/min/1.73 — AB (ref >59)

## 2023-10-17 LAB — LIPID PANEL
Cholesterol, Total: 132 mg/dL (ref 100–199)
HDL: 36 mg/dL — AB (ref >39)
LDL CALC COMMENT:: 3.7 ratio (ref 0.0–5.0)
LDL Chol Calc (NIH): 69 mg/dL (ref 0–99)
Triglycerides: 156 mg/dL — AB (ref 0–149)
VLDL Cholesterol Cal: 27 mg/dL (ref 5–40)

## 2023-10-24 ENCOUNTER — Ambulatory Visit: Payer: Self-pay | Admitting: Family Medicine

## 2023-11-01 ENCOUNTER — Other Ambulatory Visit (HOSPITAL_BASED_OUTPATIENT_CLINIC_OR_DEPARTMENT_OTHER): Payer: Self-pay

## 2023-11-01 ENCOUNTER — Other Ambulatory Visit: Payer: Self-pay

## 2023-11-19 DIAGNOSIS — K080 Exfoliation of teeth due to systemic causes: Secondary | ICD-10-CM | POA: Diagnosis not present

## 2023-11-26 ENCOUNTER — Ambulatory Visit (INDEPENDENT_AMBULATORY_CARE_PROVIDER_SITE_OTHER): Payer: Medicare Other

## 2023-11-26 ENCOUNTER — Other Ambulatory Visit: Payer: Self-pay

## 2023-11-26 VITALS — BP 96/63 | HR 81 | Ht 71.0 in | Wt 214.0 lb

## 2023-11-26 DIAGNOSIS — Z0000 Encounter for general adult medical examination without abnormal findings: Secondary | ICD-10-CM

## 2023-11-26 NOTE — Patient Instructions (Signed)
 Mr. Matthew Bolton , Thank you for taking time out of your busy schedule to complete your Annual Wellness Visit with me. I enjoyed our conversation and look forward to speaking with you again next year. I, as well as your care team,  appreciate your ongoing commitment to your health goals. Please review the following plan we discussed and let me know if I can assist you in the future. Your Game plan/ To Do List    Referrals: If you haven't heard from the office you've been referred to, please reach out to them at the phone provided.   Follow up Visits: We will see or speak with you next year for your Next Medicare AWV with our clinical staff on 11/26/24 at 8:40a.m. Have you seen your provider in the last 6 months (3 months if uncontrolled diabetes)? Yes  Clinician Recommendations:  Aim for 30 minutes of exercise or brisk walking, 6-8 glasses of water, and 5 servings of fruits and vegetables each day.       This is a list of the screenings recommended for you:  Health Maintenance  Topic Date Due   Flu Shot  10/25/2023   Medicare Annual Wellness Visit  11/22/2023   COVID-19 Vaccine (4 - 2025-26 season) 11/25/2023   Yearly kidney health urinalysis for diabetes  01/28/2024   Hemoglobin A1C  04/17/2024   Eye exam for diabetics  06/05/2024   Complete foot exam   07/16/2024   Yearly kidney function blood test for diabetes  10/15/2024   Colon Cancer Screening  09/08/2028   DTaP/Tdap/Td vaccine (2 - Td or Tdap) 11/18/2029   Pneumococcal Vaccine for age over 39  Completed   Hepatitis C Screening  Completed   Zoster (Shingles) Vaccine  Completed   HPV Vaccine  Aged Out   Meningitis B Vaccine  Aged Out    Advanced directives: (Declined) Advance directive discussed with you today. Even though you declined this today, please call our office should you change your mind, and we can give you the proper paperwork for you to fill out. Advance Care Planning is important because it:  [x]  Makes sure you  receive the medical care that is consistent with your values, goals, and preferences  [x]  It provides guidance to your family and loved ones and reduces their decisional burden about whether or not they are making the right decisions based on your wishes.  Follow the link provided in your after visit summary or read over the paperwork we have mailed to you to help you started getting your Advance Directives in place. If you need assistance in completing these, please reach out to us  so that we can help you!  See attachments for Preventive Care and Fall Prevention Tips.

## 2023-11-26 NOTE — Progress Notes (Signed)
 Subjective:   Matthew Bolton is a 68 y.o. who presents for a Medicare Wellness preventive visit.  As a reminder, Annual Wellness Visits don't include a physical exam, and some assessments may be limited, especially if this visit is performed virtually. We may recommend an in-person follow-up visit with your provider if needed.  Visit Complete: Virtual I connected with  Curtistine Marina on 11/26/23 by a audio enabled telemedicine application and verified that I am speaking with the correct person using two identifiers.  Patient Location: Home  Provider Location: Home Office  I discussed the limitations of evaluation and management by telemedicine. The patient expressed understanding and agreed to proceed.  Vital Signs: Because this visit was a virtual/telehealth visit, some criteria may be missing or patient reported. Any vitals not documented were not able to be obtained and vitals that have been documented are patient reported.  VideoDeclined- This patient declined Librarian, academic. Therefore the visit was completed with audio only.  Persons Participating in Visit: Patient.  AWV Questionnaire: Yes: Patient Medicare AWV questionnaire was completed, 11/26/23, prior to this visit.  Cardiac Risk Factors include: advanced age (>59men, >41 women);diabetes mellitus;dyslipidemia;male gender     Objective:    Today's Vitals   11/26/23 0851  BP: 96/63  Pulse: 81  Weight: 214 lb (97.1 kg)  Height: 5' 11 (1.803 m)   Body mass index is 29.85 kg/m.     11/26/2023    8:46 AM 11/22/2022    9:54 AM  Advanced Directives  Does Patient Have a Medical Advance Directive? No No  Would patient like information on creating a medical advance directive?  No - Patient declined    Current Medications (verified) Outpatient Encounter Medications as of 11/26/2023  Medication Sig   acetaminophen (TYLENOL) 325 MG tablet Take 650 mg by mouth every 6 (six) hours as  needed.   atorvastatin  (LIPITOR) 20 MG tablet Take 1 tablet (20 mg total) by mouth daily.   colchicine  0.6 MG tablet Day 1: Take 1.2 mg at the first sign of flare, followed in 1 hour with a single dose of 0.6 mg. Then 1 tablet twice daily until gout resolved.   empagliflozin  (JARDIANCE ) 25 MG TABS tablet Take 1 tablet (25 mg total) by mouth daily before breakfast.   metFORMIN  (GLUCOPHAGE ) 500 MG tablet Take 1 tablet (500 mg total) by mouth 2 (two) times daily with a meal.   sildenafil  (VIAGRA ) 100 MG tablet Take 0.5-1 tablets (50-100 mg total) by mouth daily as needed for erectile dysfunction.   sitaGLIPtin  (JANUVIA ) 100 MG tablet Take 1 tablet (100 mg total) by mouth daily.   zinc gluconate 50 MG tablet Take 50 mg by mouth daily.   cholecalciferol (VITAMIN D3) 25 MCG (1000 UNIT) tablet Take 1,000 Units by mouth daily. (Patient not taking: Reported on 11/26/2023)   No facility-administered encounter medications on file as of 11/26/2023.    Allergies (verified) Patient has no known allergies.   History: Past Medical History:  Diagnosis Date   Arthritis 2014   Just in hands   Diabetes mellitus without complication (HCC)    GERD (gastroesophageal reflux disease) 2010   occurs mostly if I eat late   Gout    Hyperlipidemia    History reviewed. No pertinent surgical history. Family History  Problem Relation Age of Onset   Hypertension Mother    Cancer Mother    Diabetes Father    Arthritis Father    Miscarriages / [place] Daughter  Social History   Socioeconomic History   Marital status: Married    Spouse name: Not on file   Number of children: 2   Years of education: Not on file   Highest education level: Some college, no degree  Occupational History   Not on file  Tobacco Use   Smoking status: Former    Current packs/day: 0.00    Average packs/day: 0.3 packs/day for 15.0 years (3.8 ttl pk-yrs)    Types: Cigarettes    Start date: 56    Quit date: 62    Years  since quitting: 45.7   Smokeless tobacco: Never  Substance and Sexual Activity   Alcohol use: Yes    Alcohol/week: 1.0 standard drink of alcohol    Types: 1 Cans of beer per week    Comment: occasionally   Drug use: Never   Sexual activity: Yes    Birth control/protection: None  Other Topics Concern   Not on file  Social History Narrative   Not on file   Social Drivers of Health   Financial Resource Strain: Low Risk  (10/14/2023)   Overall Financial Resource Strain (CARDIA)    Difficulty of Paying Living Expenses: Not hard at all  Food Insecurity: No Food Insecurity (10/14/2023)   Hunger Vital Sign    Worried About Running Out of Food in the Last Year: Never true    Ran Out of Food in the Last Year: Never true  Transportation Needs: No Transportation Needs (10/14/2023)   PRAPARE - Administrator, Civil Service (Medical): No    Lack of Transportation (Non-Medical): No  Physical Activity: Insufficiently Active (10/14/2023)   Exercise Vital Sign    Days of Exercise per Week: 2 days    Minutes of Exercise per Session: 10 min  Stress: No Stress Concern Present (10/14/2023)   [employer] of Occupational Health - Occupational Stress Questionnaire    Feeling of Stress: Not at all  Social Connections: Moderately Integrated (10/14/2023)   Social Connection and Isolation Panel    Frequency of Communication with Friends and Family: Twice a week    Frequency of Social Gatherings with Friends and Family: Once a week    Attends Religious Services: More than 4 times per year    Active Member of [employer] or Organizations: No    Attends Engineer, structural: Not on file    Marital Status: Married    Tobacco Counseling Counseling given: Not Answered    Clinical Intake:  Pre-visit preparation completed: Yes  Pain : No/denies pain     BMI - recorded: 29.85 Nutritional Status: BMI 25 -29 Overweight Nutritional Risks: None Diabetes: Yes  Lab Results   Component Value Date   HGBA1C 5.9 (H) 10/16/2023   HGBA1C 5.9 (H) 07/17/2023   HGBA1C 5.9 (H) 04/10/2023     How often do you need to have someone help you when you read instructions, pamphlets, or other written materials from your doctor or pharmacy?: 1 - Never  Interpreter Needed?: No  Information entered by :: alia t w/cma   Activities of Daily Living     11/26/2023    7:48 AM  In your present state of health, do you have any difficulty performing the following activities:  Hearing? 0  Vision? 0  Difficulty concentrating or making decisions? 0  Walking or climbing stairs? 0  Dressing or bathing? 0  Doing errands, shopping? 0  Preparing Food and eating ? N  Using the Toilet? N  In the past six months, have you accidently leaked urine? N  Do you have problems with loss of bowel control? N  Managing your Medications? N  Managing your Finances? N  Housekeeping or managing your Housekeeping? N    Patient Care Team: Dettinger, Fonda LABOR, MD as PCP - General (Family Medicine)  I have updated your Care Teams any recent Medical Services you may have received from other providers in the past year.     Assessment:   This is a routine wellness examination for Leeam.  Hearing/Vision screen Hearing Screening - Comments:: Pt denies hearing dif Vision Screening - Comments:: Pt wear reading glasses/pt last ov 5mos ago 2025   Goals Addressed             This Visit's Progress    Patient Stated       Would like to clean his basement out       Depression Screen     11/26/2023    8:46 AM 10/16/2023    8:24 AM 07/17/2023    1:39 PM 04/10/2023    2:44 PM 04/04/2023    9:04 AM 11/29/2022    2:51 PM 11/22/2022    9:39 AM  PHQ 2/9 Scores  PHQ - 2 Score 0 0 0 0 0 1 0  PHQ- 9 Score   4  2 4      Fall Risk     11/26/2023    7:48 AM 10/16/2023    8:24 AM 07/17/2023    1:38 PM 04/10/2023    2:44 PM 04/04/2023    9:02 AM  Fall Risk   Falls in the past year? 0 0 0 0 0  Number falls  in past yr:  0 0  0  Injury with Fall?  0 0  0  Risk for fall due to :  No Fall Risks No Fall Risks  No Fall Risks  Follow up  Falls evaluation completed Falls evaluation completed  Falls evaluation completed    MEDICARE RISK AT HOME:  Medicare Risk at Home Any stairs in or around the home?: (Patient-Rptd) Yes If so, are there any without handrails?: (Patient-Rptd) No Home free of loose throw rugs in walkways, pet beds, electrical cords, etc?: (Patient-Rptd) Yes Adequate lighting in your home to reduce risk of falls?: (Patient-Rptd) Yes Life alert?: (Patient-Rptd) No Use of a cane, walker or w/c?: (Patient-Rptd) No Grab bars in the bathroom?: (Patient-Rptd) Yes Shower chair or bench in shower?: (Patient-Rptd) Yes Elevated toilet seat or a handicapped toilet?: (Patient-Rptd) Yes  TIMED UP AND GO:  Was the test performed?  no  Cognitive Function: 6CIT completed        11/26/2023    8:47 AM 11/22/2022    9:55 AM  6CIT Screen  What Year? 0 points 0 points  What month? 0 points 0 points  What time? 0 points 0 points  Count back from 20 0 points 0 points  Months in reverse 0 points 0 points  Repeat phrase 0 points 0 points  Total Score 0 points 0 points    Immunizations Immunization History  Administered Date(s) Administered   Influenza, Seasonal, Injecte, Preservative Fre 01/28/2019   Influenza,inj,Quad PF,6+ Mos 01/27/2021, 01/22/2022   Influenza-Unspecified 01/08/2020, 01/17/2023   Moderna Sars-Covid-2 Vaccination 06/12/2019, 07/14/2019, 01/08/2020   PNEUMOCOCCAL CONJUGATE-20 04/12/2021   Tdap 11/19/2019   Zoster Recombinant(Shingrix ) 04/30/2022, 07/30/2022    Screening Tests Health Maintenance  Topic Date Due   INFLUENZA VACCINE  10/25/2023   COVID-19 Vaccine (  4 - 2025-26 season) 11/25/2023   Diabetic kidney evaluation - Urine ACR  01/28/2024   HEMOGLOBIN A1C  04/17/2024   OPHTHALMOLOGY EXAM  06/05/2024   FOOT EXAM  07/16/2024   Diabetic kidney evaluation - eGFR  measurement  10/15/2024   Medicare Annual Wellness (AWV)  11/25/2024   Colonoscopy  09/08/2028   DTaP/Tdap/Td (2 - Td or Tdap) 11/18/2029   Pneumococcal Vaccine: 50+ Years  Completed   Hepatitis C Screening  Completed   Zoster Vaccines- Shingrix   Completed   HPV VACCINES  Aged Out   Meningococcal B Vaccine  Aged Out    Health Maintenance  Health Maintenance Due  Topic Date Due   INFLUENZA VACCINE  10/25/2023   COVID-19 Vaccine (4 - 2025-26 season) 11/25/2023   Health Maintenance Items Addressed: See Nurse Notes at the end of this note  Additional Screening:  Vision Screening: Recommended annual ophthalmology exams for early detection of glaucoma and other disorders of the eye. Would you like a referral to an eye doctor? No    Dental Screening: Recommended annual dental exams for proper oral hygiene  Community Resource Referral / Chronic Care Management: CRR required this visit?  No   CCM required this visit?  No   Plan:    I have personally reviewed and noted the following in the patient's chart:   Medical and social history Use of alcohol, tobacco or illicit drugs  Current medications and supplements including opioid prescriptions. Patient is not currently taking opioid prescriptions. Functional ability and status Nutritional status Physical activity Advanced directives List of other physicians Hospitalizations, surgeries, and ER visits in previous 12 months Vitals Screenings to include cognitive, depression, and falls Referrals and appointments  In addition, I have reviewed and discussed with patient certain preventive protocols, quality metrics, and best practice recommendations. A written personalized care plan for preventive services as well as general preventive health recommendations were provided to patient.   Ozie Ned, CMA   11/26/2023   After Visit Summary: (MyChart) Due to this being a telephonic visit, the after visit summary with patients  personalized plan was offered to patient via MyChart   Notes: Nothing significant to report at this time.

## 2023-11-27 ENCOUNTER — Other Ambulatory Visit: Payer: Self-pay

## 2023-11-30 LAB — LAB REPORT - SCANNED
Albumin/Creatinine Ratio, Urine, POC: <30
EGFR: 71

## 2023-12-02 ENCOUNTER — Ambulatory Visit: Admitting: Family Medicine

## 2023-12-02 ENCOUNTER — Encounter: Payer: Self-pay | Admitting: Family Medicine

## 2023-12-02 VITALS — BP 112/72 | HR 77 | Ht 71.0 in | Wt 220.0 lb

## 2023-12-02 DIAGNOSIS — Z23 Encounter for immunization: Secondary | ICD-10-CM

## 2023-12-02 DIAGNOSIS — Z0000 Encounter for general adult medical examination without abnormal findings: Secondary | ICD-10-CM
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
## 2023-12-06 ENCOUNTER — Other Ambulatory Visit (HOSPITAL_BASED_OUTPATIENT_CLINIC_OR_DEPARTMENT_OTHER): Payer: Self-pay

## 2024-01-10 ENCOUNTER — Ambulatory Visit: Admitting: Family Medicine

## 2024-01-16 ENCOUNTER — Ambulatory Visit: Admitting: Family Medicine

## 2024-01-24 ENCOUNTER — Other Ambulatory Visit (HOSPITAL_BASED_OUTPATIENT_CLINIC_OR_DEPARTMENT_OTHER): Payer: Self-pay

## 2024-01-28 ENCOUNTER — Other Ambulatory Visit (HOSPITAL_BASED_OUTPATIENT_CLINIC_OR_DEPARTMENT_OTHER): Payer: Self-pay

## 2024-01-29 ENCOUNTER — Other Ambulatory Visit (HOSPITAL_BASED_OUTPATIENT_CLINIC_OR_DEPARTMENT_OTHER): Payer: Self-pay

## 2024-01-30 ENCOUNTER — Ambulatory Visit (INDEPENDENT_AMBULATORY_CARE_PROVIDER_SITE_OTHER): Payer: Self-pay | Admitting: Family Medicine

## 2024-01-30 ENCOUNTER — Encounter: Payer: Self-pay | Admitting: Family Medicine

## 2024-01-30 ENCOUNTER — Other Ambulatory Visit (HOSPITAL_BASED_OUTPATIENT_CLINIC_OR_DEPARTMENT_OTHER): Payer: Self-pay

## 2024-01-30 VITALS — BP 110/65 | HR 82 | Ht 71.0 in | Wt 221.0 lb

## 2024-01-30 DIAGNOSIS — N1831 Chronic kidney disease, stage 3a: Secondary | ICD-10-CM | POA: Diagnosis not present

## 2024-01-30 DIAGNOSIS — E785 Hyperlipidemia, unspecified: Secondary | ICD-10-CM

## 2024-01-30 DIAGNOSIS — E1122 Type 2 diabetes mellitus with diabetic chronic kidney disease: Secondary | ICD-10-CM

## 2024-01-30 DIAGNOSIS — E1169 Type 2 diabetes mellitus with other specified complication: Secondary | ICD-10-CM | POA: Diagnosis not present

## 2024-01-30 DIAGNOSIS — Z7984 Long term (current) use of oral hypoglycemic drugs: Secondary | ICD-10-CM

## 2024-01-30 LAB — BAYER DCA HB A1C WAIVED: HB A1C (BAYER DCA - WAIVED): 6.4 % — ABNORMAL HIGH (ref 4.8–5.6)

## 2024-01-30 NOTE — Progress Notes (Signed)
 BP 110/65   Pulse 82   Ht 5' 11 (1.803 m)   Wt 221 lb (100.2 kg)   SpO2 97%   BMI 30.82 kg/m    Subjective:   Patient ID: Matthew Bolton, male    DOB: 01-02-1956, 68 y.o.   MRN: [id number]  HPI: Matthew Bolton is a 68 y.o. male presenting on 01/30/2024 for Medical Management of Chronic Issues, Diabetes, Hyperlipidemia, and Hypertension   Discussed the use of AI scribe software for clinical note transcription with the patient, who gave verbal consent to proceed.  History of Present Illness   Matthew Bolton is a 69 year old male with diabetes who presents for a recheck of his condition.  Glycemic control - Morning blood glucose levels generally range from 110 to 120 mg/dL - Difficulty managing diabetes attributed to dietary habits, particularly high carbohydrate intake - Meals often include starches prepared by his wife - Currently taking Jardiance , metformin , and Januvia  for diabetes management  Lipid management - Currently taking atorvastatin  for cholesterol management - No adverse effects from atorvastatin   Gout - History of gout - No gout flares or symptoms for several years  Physical activity - Engages in yard work for physical activity          Relevant past medical, surgical, family and social history reviewed and updated as indicated. Interim medical history since our last visit reviewed. Allergies and medications reviewed and updated.  Review of Systems  Constitutional:  Negative for chills and fever.  Eyes:  Negative for visual disturbance.  Respiratory:  Negative for shortness of breath and wheezing.   Cardiovascular:  Negative for chest pain and leg swelling.  Musculoskeletal:  Negative for back pain and gait problem.  Skin:  Negative for rash.  Neurological:  Negative for dizziness and light-headedness.  All other systems reviewed and are negative.   Per HPI unless specifically indicated above   Allergies as of 01/30/2024   No  Known Allergies      Medication List        Accurate as of January 30, 2024  3:52 PM. If you have any questions, ask your nurse or doctor.          acetaminophen 325 MG tablet Commonly known as: TYLENOL Take 650 mg by mouth every 6 (six) hours as needed.   atorvastatin  20 MG tablet Commonly known as: LIPITOR Take 1 tablet (20 mg total) by mouth daily.   colchicine  0.6 MG tablet Day 1: Take 1.2 mg at the first sign of flare, followed in 1 hour with a single dose of 0.6 mg. Then 1 tablet twice daily until gout resolved.   Januvia  100 MG tablet Generic drug: sitaGLIPtin  Take 1 tablet (100 mg total) by mouth daily.   Jardiance  25 MG Tabs tablet Generic drug: empagliflozin  Take 1 tablet (25 mg total) by mouth daily before breakfast.   metFORMIN  500 MG tablet Commonly known as: GLUCOPHAGE  Take 1 tablet (500 mg total) by mouth 2 (two) times daily with a meal.   sildenafil  100 MG tablet Commonly known as: Viagra  Take 0.5-1 tablets (50-100 mg total) by mouth daily as needed for erectile dysfunction.   zinc gluconate 50 MG tablet Take 50 mg by mouth daily.         Objective:   BP 110/65   Pulse 82   Ht 5' 11 (1.803 m)   Wt 221 lb (100.2 kg)   SpO2 97%   BMI 30.82 kg/m   Wt Readings from  Last 3 Encounters:  01/30/24 221 lb (100.2 kg)  12/02/23 220 lb (99.8 kg)  11/26/23 214 lb (97.1 kg)    Physical Exam Physical Exam   NECK: Thyroid no thyromegaly, no nodules. CHEST: Lungs clear to auscultation bilaterally. CARDIOVASCULAR: Heart regular rate and rhythm, no murmurs.         Assessment & Plan:   Problem List Items Addressed This Visit       Endocrine   Type 2 diabetes mellitus with stage 3a chronic kidney disease, without long-term current use of insulin (HCC) - Primary   Relevant Orders   BMP8+EGFR   Bayer DCA Hb A1c Waived   Hyperlipidemia associated with type 2 diabetes mellitus (HCC)     Genitourinary   Stage 3a chronic kidney disease (CKD)  (HCC)   Relevant Orders   BMP8+EGFR   Bayer DCA Hb A1c Waived        Type 2 Diabetes Mellitus Blood glucose levels stable at 110-120 mg/dL. Compliant with medications but reports dietary challenges. - Continue Jardiance , metformin , and Januvia .  Hyperlipidemia Managed with atorvastatin  without issues. - Continue atorvastatin .  General Health Maintenance Considering increased physical activity. Discussed potential insurance coverage for gym membership. - Encourage increased physical activity, potentially through gym membership. - Check insurance coverage for Silver Sneakers program.     A1c is up a little bit from where it was last time but is still good A1c at 6.4.  Mainly focus on diet.     Follow up plan: Return in about 3 months (around 05/01/2024), or if symptoms worsen or fail to improve, for Diabetes recheck.  Counseling provided for all of the vaccine components Orders Placed This Encounter  Procedures   BMP8+EGFR   Bayer DCA Hb A1c Waived    Fonda Levins, MD [facility] 01/30/2024, 3:52 PM

## 2024-01-31 LAB — BMP8+EGFR
BUN/Creatinine Ratio: 14 (ref 10–24)
BUN: 23 mg/dL (ref 8–27)
CO2: 21 mmol/L (ref 20–29)
Calcium: 9.2 mg/dL (ref 8.6–10.2)
Chloride: 101 mmol/L (ref 96–106)
Creatinine, Ser: 1.69 mg/dL — ABNORMAL HIGH (ref 0.76–1.27)
Glucose: 124 mg/dL — ABNORMAL HIGH (ref 70–99)
Potassium: 4 mmol/L (ref 3.5–5.2)
Sodium: 137 mmol/L (ref 134–144)
eGFR: 44 mL/min/1.73 — ABNORMAL LOW (ref >59)

## 2024-02-03 ENCOUNTER — Ambulatory Visit: Payer: Self-pay | Admitting: Family Medicine

## 2024-02-27 ENCOUNTER — Other Ambulatory Visit (HOSPITAL_BASED_OUTPATIENT_CLINIC_OR_DEPARTMENT_OTHER): Payer: Self-pay

## 2024-02-28 ENCOUNTER — Other Ambulatory Visit (HOSPITAL_BASED_OUTPATIENT_CLINIC_OR_DEPARTMENT_OTHER): Payer: Self-pay

## 2024-04-06 ENCOUNTER — Other Ambulatory Visit (HOSPITAL_BASED_OUTPATIENT_CLINIC_OR_DEPARTMENT_OTHER): Payer: Self-pay

## 2024-05-01 ENCOUNTER — Ambulatory Visit: Admitting: Family Medicine

## 2024-05-11 ENCOUNTER — Ambulatory Visit: Admitting: Family Medicine

## 2024-11-26 ENCOUNTER — Ambulatory Visit: Payer: Self-pay

## 2024-12-02 ENCOUNTER — Encounter: Payer: Self-pay | Admitting: Family Medicine
